# Patient Record
Sex: Female | Born: 1970 | Race: White | Hispanic: No | Marital: Married | Smoking: Never smoker
Health system: Southern US, Community
[De-identification: ages and names within clinical notes are randomized; demographics above are authoritative.]

## PROBLEM LIST (undated history)

## (undated) DIAGNOSIS — F32A Depression, unspecified: Secondary | ICD-10-CM

## (undated) DIAGNOSIS — T7840XA Allergy, unspecified, initial encounter: Secondary | ICD-10-CM

## (undated) DIAGNOSIS — E079 Disorder of thyroid, unspecified: Secondary | ICD-10-CM

## (undated) DIAGNOSIS — F410 Panic disorder [episodic paroxysmal anxiety] without agoraphobia: Secondary | ICD-10-CM

## (undated) DIAGNOSIS — G4700 Insomnia, unspecified: Secondary | ICD-10-CM

## (undated) DIAGNOSIS — H8109 Meniere's disease, unspecified ear: Secondary | ICD-10-CM

## (undated) DIAGNOSIS — Z975 Presence of (intrauterine) contraceptive device: Secondary | ICD-10-CM

## (undated) DIAGNOSIS — I10 Essential (primary) hypertension: Secondary | ICD-10-CM

## (undated) DIAGNOSIS — G4730 Sleep apnea, unspecified: Secondary | ICD-10-CM

## (undated) DIAGNOSIS — K219 Gastro-esophageal reflux disease without esophagitis: Secondary | ICD-10-CM

## (undated) HISTORY — DX: Panic disorder (episodic paroxysmal anxiety): F41.0

## (undated) HISTORY — DX: Disorder of thyroid, unspecified: E07.9

## (undated) HISTORY — DX: Gastro-esophageal reflux disease without esophagitis: K21.9

## (undated) HISTORY — DX: Meniere's disease, unspecified ear: H81.09

## (undated) HISTORY — DX: Allergy, unspecified, initial encounter: T78.40XA

## (undated) HISTORY — DX: Insomnia, unspecified: G47.00

## (undated) HISTORY — DX: Presence of (intrauterine) contraceptive device: Z97.5

## (undated) HISTORY — DX: Depression, unspecified: F32.A

## (undated) HISTORY — DX: Sleep apnea, unspecified: G47.30

## (undated) HISTORY — DX: Essential (primary) hypertension: I10

---

## 1997-11-19 ENCOUNTER — Emergency Department (HOSPITAL_COMMUNITY): Admission: EM | Admit: 1997-11-19 | Discharge: 1997-11-19 | Payer: Self-pay | Admitting: Emergency Medicine

## 1998-03-06 ENCOUNTER — Other Ambulatory Visit: Admission: RE | Admit: 1998-03-06 | Discharge: 1998-03-06 | Payer: Self-pay | Admitting: *Deleted

## 1998-12-30 ENCOUNTER — Inpatient Hospital Stay (HOSPITAL_COMMUNITY): Admission: AD | Admit: 1998-12-30 | Discharge: 1999-01-02 | Payer: Self-pay | Admitting: *Deleted

## 1998-12-30 ENCOUNTER — Encounter (INDEPENDENT_AMBULATORY_CARE_PROVIDER_SITE_OTHER): Payer: Self-pay

## 1999-02-07 ENCOUNTER — Other Ambulatory Visit: Admission: RE | Admit: 1999-02-07 | Discharge: 1999-02-07 | Payer: Self-pay | Admitting: *Deleted

## 2000-04-30 ENCOUNTER — Other Ambulatory Visit: Admission: RE | Admit: 2000-04-30 | Discharge: 2000-04-30 | Payer: Self-pay | Admitting: *Deleted

## 2000-11-12 ENCOUNTER — Inpatient Hospital Stay (HOSPITAL_COMMUNITY): Admission: AD | Admit: 2000-11-12 | Discharge: 2000-11-14 | Payer: Self-pay | Admitting: *Deleted

## 2001-01-01 ENCOUNTER — Other Ambulatory Visit: Admission: RE | Admit: 2001-01-01 | Discharge: 2001-01-01 | Payer: Self-pay | Admitting: *Deleted

## 2002-01-17 ENCOUNTER — Other Ambulatory Visit: Admission: RE | Admit: 2002-01-17 | Discharge: 2002-01-17 | Payer: Self-pay | Admitting: *Deleted

## 2002-09-22 ENCOUNTER — Encounter (INDEPENDENT_AMBULATORY_CARE_PROVIDER_SITE_OTHER): Payer: Self-pay | Admitting: Specialist

## 2002-09-22 ENCOUNTER — Encounter: Payer: Self-pay | Admitting: Internal Medicine

## 2002-09-22 ENCOUNTER — Ambulatory Visit (HOSPITAL_COMMUNITY): Admission: RE | Admit: 2002-09-22 | Discharge: 2002-09-22 | Payer: Self-pay | Admitting: Internal Medicine

## 2003-02-09 ENCOUNTER — Other Ambulatory Visit: Admission: RE | Admit: 2003-02-09 | Discharge: 2003-02-09 | Payer: Self-pay | Admitting: *Deleted

## 2004-03-19 ENCOUNTER — Other Ambulatory Visit: Admission: RE | Admit: 2004-03-19 | Discharge: 2004-03-19 | Payer: Self-pay | Admitting: Obstetrics and Gynecology

## 2005-06-04 ENCOUNTER — Other Ambulatory Visit: Admission: RE | Admit: 2005-06-04 | Discharge: 2005-06-04 | Payer: Self-pay | Admitting: Obstetrics and Gynecology

## 2007-11-26 ENCOUNTER — Encounter: Admission: RE | Admit: 2007-11-26 | Discharge: 2007-11-26 | Payer: Self-pay | Admitting: Obstetrics and Gynecology

## 2010-11-01 NOTE — Op Note (Signed)
[facility]  Patient:    Mia Vasquez, Mia Vasquez                      MRN: [id number] Proc. Date: 11/12/00 Adm. Date:  [id number] Attending:  Donne Hazel                           Operative Report  PREOPERATIVE DIAGNOSES:       1. Intrauterine pregnancy at term.                               2. Repeat cesarean section.  POSTOPERATIVE DIAGNOSES:      1. Intrauterine pregnancy at term.                               2. Repeat cesarean section.  OPERATION:                    Repeat low transverse cesarean section.  SURGEON:                      Willey Blade, M.D.  ASSISTANT:                    Trevor Iha, M.D.  ANESTHESIA:                   Spinal.  ESTIMATED BLOOD LOSS:         900 cc.  COMPLICATIONS:                None.  FINDINGS:                     At 39, through a low transverse uterine incision, a viable female infant was delivered from the vertex presentation. The baby was a female with weight pending.  Apgars were 8 and 9.  The pelvis was visualized at time of surgery and noted to be normal.  DESCRIPTION OF PROCEDURE:     The patient was taken to the operating room, where a spinal anesthetic was administered.  The patient was placed on the operating table in the left lateral tilt position, the abdomen was prepped and draped in the usual sterile fashion with Betadine and sterile drapes.  A Foley catheter was inserted sterilely.  Next, the abdomen was entered through a Pfannenstiel incision and carried down sharply in the usual fashion.  The peritoneum was atraumatically entered.  The vesicouterine peritoneum overlying the lower uterine segment was incised, and a bladder flap was bluntly and sharply created over the lower uterine segment.  A bladder blade was then placed behind the bladder.  Next, a low transverse incision was made and the uterine incision extended laterally using the operators fingers.  The membranes were  entered with clear fluid noted.  The vertex was elevated into the incision and delivered promptly and easily at 0746.  The oropharynx and nasopharynx were thoroughly bulb-suctioned and the cord doubly clamped and cut.  The baby was handed promptly to the pediatricians.  The baby did well. Apgars were 8 and 9.  The baby was a female, and weight is pending.  The placenta was manually extracted intact with three-vessel cord without difficulty.  The interior of the uterus was wiped clean thoroughly with a wet  sponge.  The uterine incision was then closed in a two-layer fashion, the first layer a running interlocking suture of #1 Vicryl.  A second imbricating suture was placed across the primary suture line with a running stitch of #1 Vicryl as well.  Good hemostasis was noted.  The pelvis was then thoroughly irrigated with copious amounts of irrigant and noted to be normal.  Hemostasis was also noted.  Attention was then turned to closure.  The rectus muscle and anterior peritoneum was reapproximated in the midline with a running stitch of #1 Vicryl.  The subfascial layers were hemostatic.  The fascia was then closed with two sutures of 0 Panacryl in running fashion.  The subcutaneous tissue was irrigated and made hemostatic using the Bovie cautery.  The skin reapproximated with staples and a sterile dressing applied.  Final sponge, needle, and instrument count was correct x 3.  There were no perioperative complications.  The patient did receive 1 g of Cefotan after delivery. DD:  11/12/00 TD:  11/12/00 Job: [id number]

## 2010-11-01 NOTE — Discharge Summary (Signed)
[facility]  Patient:    Mia Vasquez, Mia Vasquez                      MRN: [id number] Adm. Date:  [id number] Disc. Date: [id number] Attending:  Donne Hazel Dictator:   Danie Chandler, R.N.                           Discharge Summary  ADMITTING DIAGNOSES:          1. Intrauterine pregnancy at term.                               2. Repeat cesarean section.  DISCHARGE DIAGNOSES:          1. Intrauterine pregnancy at term.                               2. Repeat cesarean section.  PROCEDURE ON Nov 12, 2000:    Repeat low transverse cesarean section.  REASON FOR ADMISSION:         Please see H&P.  HOSPITAL COURSE:              The patient was taken to the operating room and underwent the above named procedure without complication. This was productive of a viable female infant with Apgars of 8 at one minute and 9 at five minutes. Postoperatively on day #1, the patient had a good return of bowel function. She was tolerating a regular diet. Her hemoglobin on this day was 9.6, hematocrit 28.4, and white blood cell count 13.3. On postoperative day #2, the patient had good control of pain, was ambulating well without difficulty, and she requested discharge home.  CONDITION ON DISCHARGE:       Good.  DIET:                         Regular as tolerated.  ACTIVITY:                     No heavy lifting, no driving, no vaginal entry.  DISCHARGE FOLLOWUP:           She is to follow up in the office for staple removal and she is to call for temperature greater than 100 degrees, persistent nausea or vomiting, heavy vaginal bleeding, and/or redness or drainage from the incision site.  DISCHARGE MEDICATIONS:        1. Prenatal vitamin one p.o. q.d.                               2. Tylox as directed by M.D. DD:  12/07/00 TD:  12/07/00 Job: [id number]

## 2011-07-07 ENCOUNTER — Other Ambulatory Visit (HOSPITAL_COMMUNITY): Payer: Self-pay | Admitting: Obstetrics and Gynecology

## 2011-07-07 DIAGNOSIS — E041 Nontoxic single thyroid nodule: Secondary | ICD-10-CM

## 2011-07-08 ENCOUNTER — Other Ambulatory Visit (HOSPITAL_COMMUNITY): Payer: Self-pay

## 2011-07-11 ENCOUNTER — Ambulatory Visit (HOSPITAL_COMMUNITY)
Admission: RE | Admit: 2011-07-11 | Discharge: 2011-07-11 | Disposition: A | Discharge status: Home / Self Care | Payer: BC Managed Care – PPO | Source: Ambulatory Visit | Attending: Obstetrics and Gynecology | Admitting: Obstetrics and Gynecology

## 2011-07-11 DIAGNOSIS — E042 Nontoxic multinodular goiter: Secondary | ICD-10-CM | POA: Insufficient documentation

## 2011-07-11 DIAGNOSIS — E041 Nontoxic single thyroid nodule: Secondary | ICD-10-CM

## 2011-08-11 ENCOUNTER — Encounter (INDEPENDENT_AMBULATORY_CARE_PROVIDER_SITE_OTHER): Payer: Self-pay | Admitting: Surgery

## 2011-08-18 ENCOUNTER — Ambulatory Visit (INDEPENDENT_AMBULATORY_CARE_PROVIDER_SITE_OTHER): Payer: BC Managed Care – PPO | Admitting: Surgery

## 2011-09-17 ENCOUNTER — Ambulatory Visit (INDEPENDENT_AMBULATORY_CARE_PROVIDER_SITE_OTHER): Payer: BC Managed Care – PPO | Admitting: Surgery

## 2011-09-17 ENCOUNTER — Encounter (INDEPENDENT_AMBULATORY_CARE_PROVIDER_SITE_OTHER): Payer: Self-pay | Admitting: Surgery

## 2011-09-17 VITALS — BP 132/88 | HR 81 | Temp 97.7°F | Resp 16 | Ht 67.0 in | Wt 208.4 lb

## 2011-09-17 DIAGNOSIS — E042 Nontoxic multinodular goiter: Secondary | ICD-10-CM

## 2011-09-17 NOTE — Patient Instructions (Signed)
Thyroid Diseases Your thyroid is a butterfly-shaped gland in your neck. It is located just above your collarbone. It is one of your endocrine glands, which make hormones. The thyroid helps set your metabolism. Metabolism is how your body gets energy from the foods you eat.  Millions of people have thyroid diseases. Women experience thyroid problems more often than men. In fact, overactive thyroid problems (hyperthyroidism) occur in 1% of all women. If you have a thyroid disease, your body may use energy more slowly or quickly than it should.  Thyroid problems also include an immune disease where your body reacts against your thyroid gland (called thyroiditis). A different problem involves lumps and bumps (called nodules) that develop in the gland. The nodules are usually, but not always, noncancerous. THE MOST COMMON THYROID PROBLEMS AND CAUSES ARE DISCUSSED BELOW There are many causes for thyroid problems. Treatment depends upon the exact diagnosis and includes trying to reset your body's metabolism to a normal rate. Hyperthyroidism Too much thyroid hormone from an overactive thyroid gland is called hyperthyroidism. In hyperthyroidism, the body's metabolism speeds up. One of the most frequent forms of hyperthyroidism is known as Graves' disease. Graves' disease tends to run in families. Although Graves' is thought to be caused by a problem with the immune system, the exact nature of the genetic problem is unknown. Hypothyroidism Too little thyroid hormone from an underactive thyroid gland is called hypothyroidism. In hypothyroidism, the body's metabolism is slowed. Several things can cause this condition. Most causes affect the thyroid gland directly and hurt its ability to make enough hormone.  Rarely, there may be a pituitary gland tumor (located near the base of the brain). The tumor can block the pituitary from producing thyroid-stimulating hormone (TSH). Your body makes TSH to stimulate the thyroid  to work properly. If the pituitary does not make enough TSH, the thyroid fails to make enough hormones needed for good health. Whether the problem is caused by thyroid conditions or by the pituitary gland, the result is that the thyroid is not making enough hormones. Hypothyroidism causes many physical and mental processes to become sluggish. The body consumes less oxygen and produces less body heat. Thyroid Nodules A thyroid nodule is a small swelling or lump in the thyroid gland. They are common. These nodules represent either a growth of thyroid tissue or a fluid-filled cyst. Both form a lump in the thyroid gland. Almost half of all people will have tiny thyroid nodules at some point in their lives. Typically, these are not noticeable until they become large and affect normal thyroid size. Larger nodules that are greater than a half inch across (about 1 centimeter) occur in about 5 percent of people. Although most nodules are not cancerous, people who have them should seek medical care to rule out cancer. Also, some thyroid nodules may produce too much thyroid hormone or become too large. Large nodules or a large gland can interfere with breathing or swallowing or may cause neck discomfort. Other problems Other thyroid problems include cancer and thyroiditis. Thyroiditis is a malfunction of the body's immune system. Normally, the immune system works to defend the body against infection and other problems. When the immune system is not working properly, it may mistakenly attack normal cells, tissues, and organs. Examples of autoimmune diseases are Hashimoto's thyroiditis (which causes low thyroid function) and Graves' disease (which causes excess thyroid function). SYMPTOMS  Symptoms vary greatly depending upon the exact type of problem with the thyroid. Hyperthyroidism-is when your thyroid is too   active and makes more thyroid hormone than your body needs. The most common cause is Graves' Disease. Too  much thyroid hormone can cause some or all of the following symptoms:  Anxiety.   Irritability.   Difficulty sleeping.   Fatigue.   A rapid or irregular heartbeat.   A fine tremor of your hands or fingers.   An increase in perspiration.   Sensitivity to heat.   Weight loss, despite normal food intake.   Brittle hair.   Enlargement of your thyroid gland (goiter).   Light menstrual periods.   Frequent bowel movements.  Graves' disease can specifically cause eye and skin problems. The skin problems involve reddening and swelling of the skin, often on your shins and on the top of your feet. Eye problems can include the following:  Excess tearing and sensation of grit or sand in either or both eyes.   Reddened or inflamed eyes.   Widening of the space between your eyelids.   Swelling of the lids and tissues around the eyes.   Light sensitivity.   Ulcers on the cornea.   Double vision.   Limited eye movements.   Blurred or reduced vision.  Hypothyroidism- is when your thyroid gland is not active enough. This is more common than hyperthyroidism. Symptoms can vary a lot depending of the severity of the hormone deficiency. Symptoms may develop over a long period of time and can include several of the following:  Fatigue.   Sluggishness.   Increased sensitivity to cold.   Constipation.   Pale, dry skin.   A puffy face.   Hoarse voice.   High blood cholesterol level.   Unexplained weight gain.   Muscle aches, tenderness and stiffness.   Pain, stiffness or swelling in your joints.   Muscle weakness.   Heavier than normal menstrual periods.   Brittle fingernails and hair.   Depression.  Thyroid Nodules - most do not cause signs or symptoms. Occasionally, some may become so large that you can feel or even see the swelling at the base of your neck. You may realize a lump or swelling is there when you are shaving or putting on makeup. Men might become  aware of a nodule when shirt collars suddenly feel too tight. Some nodules produce too much thyroid hormone. This can produce the same symptoms as hyperthyroidism (see above). Thyroid nodules are seldom cancerous. However, a nodule is more likely to be malignant (cancerous) if it:  Grows quickly or feels hard.   Causes you to become hoarse or to have trouble swallowing or breathing.   Causes enlarged lymph nodes under your jaw or in your neck.  DIAGNOSIS  Because there are so many possible thyroid conditions, your caregiver may ask for a number of tests. They will do this in order to narrow down the exact diagnosis. These tests can include:  Blood and antibody tests.   Special thyroid scans using small, safe amounts of radioactive iodine.   Ultrasound of the thyroid gland (particularly if there is a nodule or lump).   Biopsy. This is usually done with a special needle. A needle biopsy is a procedure to obtain a sample of cells from the thyroid. The tissue will be tested in a lab and examined under a microscope.  TREATMENT  Treatment depends on the exact diagnosis. Hyperthyroidism  Beta-blockers help relieve many of the symptoms.   Anti-thyroid medications prevent the thyroid from making excess hormones.   Radioactive iodine treatment can destroy overactive thyroid   cells. The iodine can permanently decrease the amount of hormone produced.   Surgery to remove the thyroid gland.   Treatments for eye problems that come from Graves' disease also include medications and special eye surgery, if felt to be appropriate.  Hypothyroidism Thyroid replacement with levothyroxine is the mainstay of treatment. Treatment with thyroid replacement is usually lifelong and will require monitoring and adjustment from time to time. Thyroid Nodules  Watchful waiting. If a small nodule causes no symptoms or signs of cancer on biopsy, then no treatment may be chosen at first. Re-exam and re-checking blood  tests would be the recommended follow-up.   Anti-thyroid medications or radioactive iodine treatment may be recommended if the nodules produce too much thyroid hormone (see Treatment for Hyperthyroidism above).   Alcohol ablation. Injections of small amounts of ethyl alcohol (ethanol) can cause a non-cancerous nodule to shrink in size.   Surgery (see Treatment for Hyperthyroidism above).  HOME CARE INSTRUCTIONS   Take medications as instructed.   Follow through on recommended testing.  SEEK MEDICAL CARE IF:   You feel that you are developing symptoms of Hyperthyroidism or Hypothyroidism as described above.   You develop a new lump/nodule in the neck/thyroid area that you had not noticed before.   You feel that you are having side effects from medicines prescribed.   You develop trouble breathing or swallowing.  SEEK IMMEDIATE MEDICAL CARE IF:   You develop a fever of 102 F (38.9 C) or higher.   You develop severe sweating.   You develop palpitations and/or rapid heart beat.   You develop shortness of breath.   You develop nausea and vomiting.   You develop extreme shakiness.   You develop agitation.   You develop lightheadedness or have a fainting episode.  Document Released: 03/30/2007 Document Revised: 05/22/2011 Document Reviewed: 03/30/2007 Exit[employer] Patient Information 2012 [employer]. 

## 2011-09-17 NOTE — Progress Notes (Signed)
Chief Complaint  Patient presents with  . Thyroid Nodule    Evaluate thyroid - referral from Dr. Candice Camp, [facility]    HISTORY: Patient is a 41 year old  Female referred by her gynecologist for evaluation of bilateral thyroid nodules. These have been present for at least 10 years. She had undergone previous ultrasound and biopsy in 2004. This showed benign follicular epithelium consistent with a colloid nodule. There was no atypia.  Patient did have sporadic followup over the next few years. In January she was seen by her gynecologist. She again was noted to have palpable thyroid nodules. She underwent a thyroid ultrasound showing bilateral thyroid nodules and cyst.The dominant nodule was in the left lobe measuring 2.0 cm. This was unchanged compared to her study in 2004. Thyroid function tests were checked in her TSH level was normal at 1.36.  The patient has had no prior surgery on the head or neck. She has never been on thyroid medication. There is no family history of thyroid disease. There is no family history of other endocrinopathy.  Past Medical History  Diagnosis Date  . Thyroid disease   . Hypertension   . Panic attack   . IUD (intrauterine device) in place      Current Outpatient Prescriptions  Medication Sig Dispense Refill  . clonazePAM (KLONOPIN) 0.5 MG tablet Take 0.5 mg by mouth 2 (two) times daily as needed.      Marland Kitchen levonorgestrel (MIRENA) 20 MCG/24HR IUD 1 each by Intrauterine route once.      Marland Kitchen PARoxetine (PAXIL) 20 MG tablet Take 20 mg by mouth every morning.      . triamterene-hydrochlorothiazide (MAXZIDE) 75-50 MG per tablet       . Vitamin D, Ergocalciferol, (DRISDOL) 50000 UNITS CAPS       . chlorpheniramine-HYDROcodone (TUSSIONEX) 10-8 MG/5ML LQCR       . triamterene (DYRENIUM) 100 MG capsule Take by mouth 2 (two) times daily.         Allergies  Allergen Reactions  . Shrimp (Shellfish Allergy) Anaphylaxis     Family History    Problem Relation Age of Onset  . Heart disease    . Hypertension    . Cancer    . Cancer Maternal Grandmother     Ovarian Cancer  . Cancer Maternal Grandfather     Lung Cancer  . Cancer Paternal Grandmother     breast cancer     History   Social History  . Marital Status: Married    Spouse Name: N/A    Number of Children: N/A  . Years of Education: N/A   Social History Main Topics  . Smoking status: Never Smoker   . Smokeless tobacco: None  . Alcohol Use: No  . Drug Use: No  . Sexually Active: None   Other Topics Concern  . None   Social History Narrative  . None     REVIEW OF SYSTEMS - PERTINENT POSITIVES ONLY: Denies tremor. Denies palpitations. Denies compressive symptoms.  EXAM: Filed Vitals:   09/17/11 0851  BP: 132/88  Pulse: 81  Temp: 97.7 F (36.5 C)  Resp: 16    HEENT: normocephalic; pupils equal and reactive; sclerae clear; dentition good; mucous membranes moist NECK:  Thyroid slightly larger than normal. Palpation reveals a soft 2 cm nodule in the left thyroid lobe. This is mobile. It is nontender. Remainder of the gland is slightly firm without dominant or discrete mass; symmetric on extension; no palpable anterior or  posterior cervical lymphadenopathy; no supraclavicular masses; no tenderness CHEST: clear to auscultation bilaterally without rales, rhonchi, or wheezes CARDIAC: regular rate and rhythm without significant murmur; peripheral pulses are full EXT:  non-tender without edema; no deformity NEURO: no gross focal deficits; no sign of tremor   LABORATORY RESULTS: See [facility] ([facility]pic) for most recent results   RADIOLOGY RESULTS: See [facility] (CHL[facility]pic) for most recent results   IMPRESSION: Multinodular thyroid gland, dominant left thyroid nodule, 2.0 cm, clinically stable  PLAN: Patient and I discussed all the above findings. I have recommended continued close observation. I would like her to eat her thyroid  ultrasound and thyroid function test in one year and repeat her physical examination at that time. I do not see any reason for surgical intervention at this point. She is in agreement and will return for followup as scheduled.  Velora Heckler, MD, FACS General & Endocrine Surgery [facility]   Visit Diagnoses: 1. Multinodular goiter (nontoxic), dominant nodule left lobe     Primary Care Physician: Dr. Tommas Olp  GYN: Dr. Candice Camp

## 2012-07-20 ENCOUNTER — Other Ambulatory Visit: Payer: BC Managed Care – PPO

## 2012-07-21 ENCOUNTER — Telehealth (INDEPENDENT_AMBULATORY_CARE_PROVIDER_SITE_OTHER): Payer: Self-pay

## 2012-07-21 NOTE — Telephone Encounter (Signed)
Per pt request tsh lab slip for [facility] mailed to pt home address.

## 2012-07-23 ENCOUNTER — Ambulatory Visit
Admission: RE | Admit: 2012-07-23 | Discharge: 2012-07-23 | Disposition: A | Discharge status: Home / Self Care | Payer: BC Managed Care – PPO | Source: Ambulatory Visit | Attending: Surgery | Admitting: Surgery

## 2012-07-23 DIAGNOSIS — E042 Nontoxic multinodular goiter: Secondary | ICD-10-CM

## 2012-08-24 ENCOUNTER — Encounter (INDEPENDENT_AMBULATORY_CARE_PROVIDER_SITE_OTHER): Payer: Self-pay

## 2012-09-29 ENCOUNTER — Encounter (INDEPENDENT_AMBULATORY_CARE_PROVIDER_SITE_OTHER): Payer: Self-pay | Admitting: Surgery

## 2012-09-29 ENCOUNTER — Ambulatory Visit (INDEPENDENT_AMBULATORY_CARE_PROVIDER_SITE_OTHER): Payer: BC Managed Care – PPO | Admitting: Surgery

## 2012-09-29 VITALS — BP 122/80 | HR 83 | Temp 97.6°F | Resp 16 | Ht 67.0 in | Wt 228.2 lb

## 2012-09-29 DIAGNOSIS — E042 Nontoxic multinodular goiter: Secondary | ICD-10-CM

## 2012-09-29 NOTE — Patient Instructions (Signed)

## 2012-09-29 NOTE — Progress Notes (Signed)
General Surgery [facility]  Visit Diagnoses: 1. Multinodular goiter (nontoxic), dominant nodule left lobe     HISTORY: Patient is a 42 year old white female followed for bilateral thyroid nodules. She was last seen here one year ago. At my request she underwent a thyroid ultrasound on 07/23/2012. This was compared to her prior study in January 2013. The right lobe is slightly enlarged at 5.6 cm. Left lobe is slightly thickened at 2.5 cm. There are subcentimeter nodules in the upper right lobe. There is a dominant nodule in the left lobe measuring 1.8 x 1.2 x 2.2 cm. This shows slight interval increase in size. It has been previously biopsied with benign cytopathology. Overall the study was felt to be stable by the radiologist.  Repeat TSH level was obtained on 08/12/2012. Is normal at 1.78. The patient is on no thyroid medication.  PERTINENT REVIEW OF SYSTEMS: Denies tremor. Denies palpitations. Denies compressive symptoms. Denies masses. Denies pain.  EXAM: HEENT: normocephalic; pupils equal and reactive; sclerae clear; dentition good; mucous membranes moist NECK:  Palpable nodule in the left thyroid lobe, smooth, mobile, nontender; right thyroid lobe without dominant or discrete mass; symmetric on extension; no palpable anterior or posterior cervical lymphadenopathy; no supraclavicular masses; no tenderness CHEST: clear to auscultation bilaterally without rales, rhonchi, or wheezes CARDIAC: regular rate and rhythm without significant murmur; peripheral pulses are full EXT:  non-tender without edema; no deformity NEURO: no gross focal deficits; no sign of tremor   IMPRESSION: Bilateral thyroid nodules, dominant left thyroid nodule, 2.2 cm, slight interval increase in size  PLAN: The patient and I reviewed the above studies. Overall I feel her small multinodular goiter is stable. However, given the slight interval increase in size of the dominant nodule on the left, I  would like to repeat her thyroid ultrasound in one year. We will also check her TSH level at that time. Patient does not require thyroid hormone supplementation at this point. She does not require operative intervention at this time.  Patient will return in 1 year for physical examination and review of the above studies.  Velora Heckler, MD, [facility] Office: [phone]

## 2013-05-25 DIAGNOSIS — I10 Essential (primary) hypertension: Secondary | ICD-10-CM | POA: Insufficient documentation

## 2013-05-25 DIAGNOSIS — E669 Obesity, unspecified: Secondary | ICD-10-CM | POA: Insufficient documentation

## 2013-09-16 ENCOUNTER — Other Ambulatory Visit: Payer: BC Managed Care – PPO

## 2013-09-22 ENCOUNTER — Ambulatory Visit
Admission: RE | Admit: 2013-09-22 | Discharge: 2013-09-22 | Disposition: A | Discharge status: Home / Self Care | Payer: BC Managed Care – PPO | Source: Ambulatory Visit | Attending: Surgery | Admitting: Surgery

## 2013-09-22 DIAGNOSIS — E042 Nontoxic multinodular goiter: Secondary | ICD-10-CM

## 2013-11-08 ENCOUNTER — Encounter (INDEPENDENT_AMBULATORY_CARE_PROVIDER_SITE_OTHER): Payer: Self-pay | Admitting: Surgery

## 2013-11-08 ENCOUNTER — Ambulatory Visit (INDEPENDENT_AMBULATORY_CARE_PROVIDER_SITE_OTHER): Payer: BC Managed Care – PPO | Admitting: Surgery

## 2013-11-08 VITALS — BP 122/78 | HR 98 | Temp 97.0°F | Ht 67.0 in | Wt 223.0 lb

## 2013-11-08 DIAGNOSIS — E042 Nontoxic multinodular goiter: Secondary | ICD-10-CM

## 2013-11-08 NOTE — Patient Instructions (Signed)

## 2013-11-08 NOTE — Progress Notes (Signed)
General Surgery [facility]  Chief Complaint  Patient presents with  . Follow-up    bilateral thyroid nodules    HISTORY: Patient is a 43 year old female followed for bilateral thyroid nodules. At my request she underwent a thyroid ultrasound on 09/22/2013. This was compared to her prior study from February of 2014. The right thyroid lobe measures 5.3 cm and contains multiple subcentimeter nodules which are unchanged from her prior examination. The left lobe measures 5.0 cm and contains a dominant nodule measuring 2.3 cm in size which is also unchanged from her prior examination. There was no lymphadenopathy. Final reading was stable bilateral thyroid nodules. TSH level is normal at 2.150.  PERTINENT REVIEW OF SYSTEMS: Denies tremor. Denies palpitations. Denies compressive symptoms. Occasional mild dysphagia. Patient is able to palpate dominant nodule on the left.  EXAM: HEENT: normocephalic; pupils equal and reactive; sclerae clear; dentition good; mucous membranes moist NECK:  Right lobe without palpable abnormalities; left lobe with dominant nodule measuring approximately 2 cm in size, firm, mobile, nontender; symmetric on extension; no palpable anterior or posterior cervical lymphadenopathy; no supraclavicular masses; no tenderness CHEST: clear to auscultation bilaterally without rales, rhonchi, or wheezes CARDIAC: regular rate and rhythm without significant murmur; peripheral pulses are full EXT:  non-tender without edema; no deformity NEURO: no gross focal deficits; no sign of tremor   IMPRESSION: Stable bilateral thyroid nodules  PLAN: I discussed the above results with the patient at length. We reviewed her ultrasound report and her laboratory studies. At this point I recommend continued observation. We will repeat her ultrasound and TSH level in one year.  Velora Heckler, MD, [facility] Office: [phone]  Visit Diagnoses: 1.  Multinodular goiter (nontoxic), dominant nodule left lobe

## 2013-11-16 ENCOUNTER — Encounter (INDEPENDENT_AMBULATORY_CARE_PROVIDER_SITE_OTHER): Payer: Self-pay

## 2014-08-21 ENCOUNTER — Other Ambulatory Visit (INDEPENDENT_AMBULATORY_CARE_PROVIDER_SITE_OTHER): Payer: Self-pay | Admitting: Surgery

## 2014-08-21 DIAGNOSIS — E041 Nontoxic single thyroid nodule: Secondary | ICD-10-CM

## 2014-08-21 NOTE — Addendum Note (Signed)
Addended by: Michie Molnar M on: 08/21/2014 03:44 PM   Modules accepted: Orders  

## 2014-08-22 ENCOUNTER — Other Ambulatory Visit (INDEPENDENT_AMBULATORY_CARE_PROVIDER_SITE_OTHER): Payer: Self-pay | Admitting: *Deleted

## 2014-08-22 DIAGNOSIS — E041 Nontoxic single thyroid nodule: Secondary | ICD-10-CM

## 2014-10-17 ENCOUNTER — Ambulatory Visit
Admission: RE | Admit: 2014-10-17 | Discharge: 2014-10-17 | Disposition: A | Discharge status: Home / Self Care | Payer: BLUE CROSS/BLUE SHIELD | Source: Ambulatory Visit | Attending: Surgery | Admitting: Surgery

## 2014-10-17 DIAGNOSIS — E041 Nontoxic single thyroid nodule: Secondary | ICD-10-CM

## 2014-10-19 ENCOUNTER — Other Ambulatory Visit: Payer: Self-pay | Admitting: Obstetrics and Gynecology

## 2014-10-19 DIAGNOSIS — R928 Other abnormal and inconclusive findings on diagnostic imaging of breast: Secondary | ICD-10-CM

## 2014-10-26 ENCOUNTER — Ambulatory Visit
Admission: RE | Admit: 2014-10-26 | Discharge: 2014-10-26 | Disposition: A | Discharge status: Home / Self Care | Payer: BLUE CROSS/BLUE SHIELD | Source: Ambulatory Visit | Attending: Obstetrics and Gynecology | Admitting: Obstetrics and Gynecology

## 2014-10-26 DIAGNOSIS — R928 Other abnormal and inconclusive findings on diagnostic imaging of breast: Secondary | ICD-10-CM

## 2015-10-05 DIAGNOSIS — Z8669 Personal history of other diseases of the nervous system and sense organs: Secondary | ICD-10-CM | POA: Diagnosis not present

## 2015-10-05 DIAGNOSIS — H9319 Tinnitus, unspecified ear: Secondary | ICD-10-CM | POA: Diagnosis not present

## 2015-10-05 DIAGNOSIS — H9202 Otalgia, left ear: Secondary | ICD-10-CM | POA: Diagnosis not present

## 2015-10-05 DIAGNOSIS — J342 Deviated nasal septum: Secondary | ICD-10-CM | POA: Diagnosis not present

## 2015-10-05 DIAGNOSIS — H9193 Unspecified hearing loss, bilateral: Secondary | ICD-10-CM | POA: Diagnosis not present

## 2015-10-11 DIAGNOSIS — H9312 Tinnitus, left ear: Secondary | ICD-10-CM | POA: Diagnosis not present

## 2015-10-11 DIAGNOSIS — H9319 Tinnitus, unspecified ear: Secondary | ICD-10-CM | POA: Diagnosis not present

## 2015-10-11 DIAGNOSIS — Z8669 Personal history of other diseases of the nervous system and sense organs: Secondary | ICD-10-CM | POA: Diagnosis not present

## 2015-10-11 DIAGNOSIS — H9190 Unspecified hearing loss, unspecified ear: Secondary | ICD-10-CM | POA: Diagnosis not present

## 2015-11-26 DIAGNOSIS — R1084 Generalized abdominal pain: Secondary | ICD-10-CM | POA: Diagnosis not present

## 2015-11-26 DIAGNOSIS — I10 Essential (primary) hypertension: Secondary | ICD-10-CM | POA: Diagnosis not present

## 2015-11-26 DIAGNOSIS — R1012 Left upper quadrant pain: Secondary | ICD-10-CM | POA: Diagnosis not present

## 2015-12-03 DIAGNOSIS — R1083 Colic: Secondary | ICD-10-CM | POA: Diagnosis not present

## 2015-12-03 DIAGNOSIS — I10 Essential (primary) hypertension: Secondary | ICD-10-CM | POA: Diagnosis not present

## 2015-12-06 DIAGNOSIS — Z1231 Encounter for screening mammogram for malignant neoplasm of breast: Secondary | ICD-10-CM | POA: Diagnosis not present

## 2015-12-06 DIAGNOSIS — Z6834 Body mass index (BMI) 34.0-34.9, adult: Secondary | ICD-10-CM | POA: Diagnosis not present

## 2015-12-06 DIAGNOSIS — Z01419 Encounter for gynecological examination (general) (routine) without abnormal findings: Secondary | ICD-10-CM | POA: Diagnosis not present

## 2016-01-01 DIAGNOSIS — R1083 Colic: Secondary | ICD-10-CM | POA: Diagnosis not present

## 2016-01-01 DIAGNOSIS — I10 Essential (primary) hypertension: Secondary | ICD-10-CM | POA: Diagnosis not present

## 2016-04-01 DIAGNOSIS — R509 Fever, unspecified: Secondary | ICD-10-CM | POA: Diagnosis not present

## 2016-04-01 DIAGNOSIS — B9689 Other specified bacterial agents as the cause of diseases classified elsewhere: Secondary | ICD-10-CM | POA: Diagnosis not present

## 2016-04-01 DIAGNOSIS — I10 Essential (primary) hypertension: Secondary | ICD-10-CM | POA: Diagnosis not present

## 2016-04-01 DIAGNOSIS — J208 Acute bronchitis due to other specified organisms: Secondary | ICD-10-CM | POA: Diagnosis not present

## 2016-08-07 DIAGNOSIS — H6691 Otitis media, unspecified, right ear: Secondary | ICD-10-CM | POA: Diagnosis not present

## 2016-08-07 DIAGNOSIS — H7291 Unspecified perforation of tympanic membrane, right ear: Secondary | ICD-10-CM | POA: Diagnosis not present

## 2016-08-07 DIAGNOSIS — Z1389 Encounter for screening for other disorder: Secondary | ICD-10-CM | POA: Diagnosis not present

## 2016-09-02 DIAGNOSIS — Z0000 Encounter for general adult medical examination without abnormal findings: Secondary | ICD-10-CM | POA: Diagnosis not present

## 2016-09-02 DIAGNOSIS — E669 Obesity, unspecified: Secondary | ICD-10-CM | POA: Diagnosis not present

## 2016-09-02 DIAGNOSIS — Z6838 Body mass index (BMI) 38.0-38.9, adult: Secondary | ICD-10-CM | POA: Diagnosis not present

## 2016-09-02 DIAGNOSIS — Z131 Encounter for screening for diabetes mellitus: Secondary | ICD-10-CM | POA: Diagnosis not present

## 2016-10-15 ENCOUNTER — Other Ambulatory Visit: Payer: Self-pay | Admitting: Surgery

## 2016-10-15 DIAGNOSIS — E042 Nontoxic multinodular goiter: Secondary | ICD-10-CM

## 2017-03-05 ENCOUNTER — Other Ambulatory Visit: Payer: BLUE CROSS/BLUE SHIELD

## 2017-03-09 ENCOUNTER — Ambulatory Visit
Admission: RE | Admit: 2017-03-09 | Discharge: 2017-03-09 | Disposition: A | Discharge status: Home / Self Care | Payer: BLUE CROSS/BLUE SHIELD | Source: Ambulatory Visit | Attending: Surgery | Admitting: Surgery

## 2017-03-09 DIAGNOSIS — E042 Nontoxic multinodular goiter: Secondary | ICD-10-CM | POA: Diagnosis not present

## 2017-03-12 DIAGNOSIS — E042 Nontoxic multinodular goiter: Secondary | ICD-10-CM | POA: Diagnosis not present

## 2017-03-14 DIAGNOSIS — J019 Acute sinusitis, unspecified: Secondary | ICD-10-CM | POA: Diagnosis not present

## 2017-05-11 DIAGNOSIS — Z6836 Body mass index (BMI) 36.0-36.9, adult: Secondary | ICD-10-CM | POA: Diagnosis not present

## 2017-05-11 DIAGNOSIS — Z01419 Encounter for gynecological examination (general) (routine) without abnormal findings: Secondary | ICD-10-CM | POA: Diagnosis not present

## 2017-05-11 DIAGNOSIS — Z1231 Encounter for screening mammogram for malignant neoplasm of breast: Secondary | ICD-10-CM | POA: Diagnosis not present

## 2017-06-04 DIAGNOSIS — Z30433 Encounter for removal and reinsertion of intrauterine contraceptive device: Secondary | ICD-10-CM | POA: Diagnosis not present

## 2017-06-25 DIAGNOSIS — Z6835 Body mass index (BMI) 35.0-35.9, adult: Secondary | ICD-10-CM | POA: Diagnosis not present

## 2017-06-25 DIAGNOSIS — J329 Chronic sinusitis, unspecified: Secondary | ICD-10-CM | POA: Diagnosis not present

## 2017-07-15 DIAGNOSIS — N939 Abnormal uterine and vaginal bleeding, unspecified: Secondary | ICD-10-CM | POA: Diagnosis not present

## 2017-10-27 DIAGNOSIS — K5900 Constipation, unspecified: Secondary | ICD-10-CM | POA: Diagnosis not present

## 2017-10-27 DIAGNOSIS — E669 Obesity, unspecified: Secondary | ICD-10-CM | POA: Diagnosis not present

## 2017-10-27 DIAGNOSIS — Z6833 Body mass index (BMI) 33.0-33.9, adult: Secondary | ICD-10-CM | POA: Diagnosis not present

## 2017-10-27 DIAGNOSIS — I10 Essential (primary) hypertension: Secondary | ICD-10-CM | POA: Diagnosis not present

## 2017-11-16 DIAGNOSIS — Z13220 Encounter for screening for lipoid disorders: Secondary | ICD-10-CM | POA: Diagnosis not present

## 2017-11-16 DIAGNOSIS — Z131 Encounter for screening for diabetes mellitus: Secondary | ICD-10-CM | POA: Diagnosis not present

## 2017-11-16 DIAGNOSIS — E669 Obesity, unspecified: Secondary | ICD-10-CM | POA: Diagnosis not present

## 2017-11-16 DIAGNOSIS — Z6832 Body mass index (BMI) 32.0-32.9, adult: Secondary | ICD-10-CM | POA: Diagnosis not present

## 2017-11-16 DIAGNOSIS — Z0000 Encounter for general adult medical examination without abnormal findings: Secondary | ICD-10-CM | POA: Diagnosis not present

## 2018-02-26 ENCOUNTER — Other Ambulatory Visit: Payer: Self-pay | Admitting: Surgery

## 2018-02-26 DIAGNOSIS — E042 Nontoxic multinodular goiter: Secondary | ICD-10-CM

## 2018-03-10 ENCOUNTER — Ambulatory Visit
Admission: RE | Admit: 2018-03-10 | Discharge: 2018-03-10 | Disposition: A | Discharge status: Home / Self Care | Payer: BLUE CROSS/BLUE SHIELD | Source: Ambulatory Visit | Attending: Surgery | Admitting: Surgery

## 2018-03-10 DIAGNOSIS — E042 Nontoxic multinodular goiter: Secondary | ICD-10-CM

## 2018-04-09 DIAGNOSIS — E042 Nontoxic multinodular goiter: Secondary | ICD-10-CM | POA: Diagnosis not present

## 2018-07-22 DIAGNOSIS — Z6836 Body mass index (BMI) 36.0-36.9, adult: Secondary | ICD-10-CM | POA: Diagnosis not present

## 2018-07-22 DIAGNOSIS — Z01419 Encounter for gynecological examination (general) (routine) without abnormal findings: Secondary | ICD-10-CM | POA: Diagnosis not present

## 2018-07-22 DIAGNOSIS — Z1231 Encounter for screening mammogram for malignant neoplasm of breast: Secondary | ICD-10-CM | POA: Diagnosis not present

## 2018-08-03 DIAGNOSIS — H6982 Other specified disorders of Eustachian tube, left ear: Secondary | ICD-10-CM | POA: Diagnosis not present

## 2018-08-03 DIAGNOSIS — H8109 Meniere's disease, unspecified ear: Secondary | ICD-10-CM | POA: Diagnosis not present

## 2018-08-03 DIAGNOSIS — E042 Nontoxic multinodular goiter: Secondary | ICD-10-CM | POA: Diagnosis not present

## 2018-08-03 DIAGNOSIS — R0989 Other specified symptoms and signs involving the circulatory and respiratory systems: Secondary | ICD-10-CM | POA: Diagnosis not present

## 2018-08-27 DIAGNOSIS — E042 Nontoxic multinodular goiter: Secondary | ICD-10-CM | POA: Diagnosis not present

## 2018-08-27 DIAGNOSIS — J302 Other seasonal allergic rhinitis: Secondary | ICD-10-CM | POA: Diagnosis not present

## 2018-08-27 DIAGNOSIS — R0989 Other specified symptoms and signs involving the circulatory and respiratory systems: Secondary | ICD-10-CM | POA: Diagnosis not present

## 2018-08-27 DIAGNOSIS — J342 Deviated nasal septum: Secondary | ICD-10-CM | POA: Diagnosis not present

## 2018-11-18 IMAGING — US US THYROID
1 series · 12 of 25 positions shown · non-contrast
Comparison: 10/17/2014

CLINICAL DATA: Multiple thyroid nodules.

EXAM:
THYROID ULTRASOUND
TECHNIQUE: Ultrasound examination of the thyroid gland and adjacent soft
tissues was performed.

[Series 1: us thyroid · 0.04mm/px · 12 of 65 slices shown]
[im 3/65]
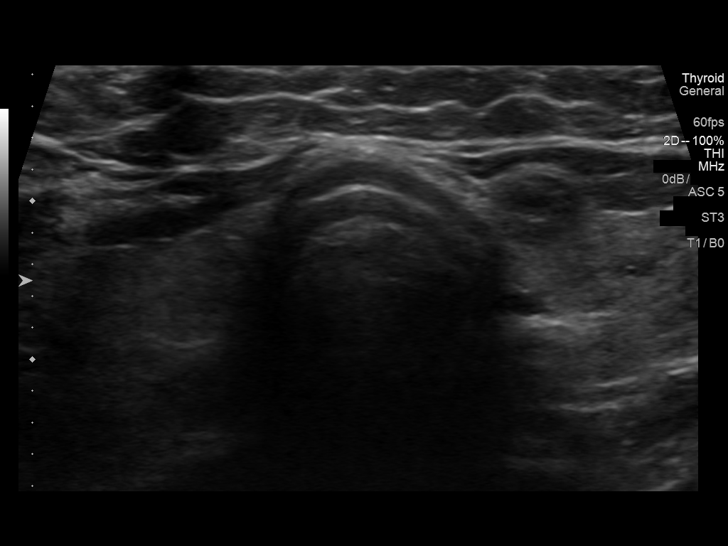
[im 9/65]
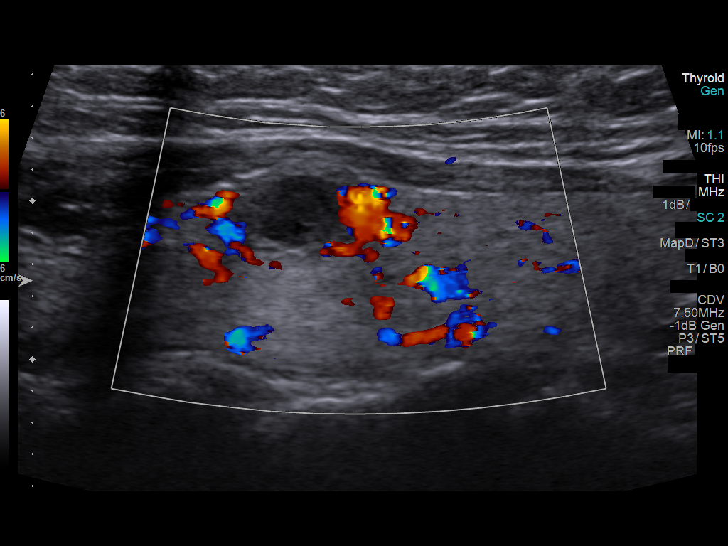
[im 14/65]
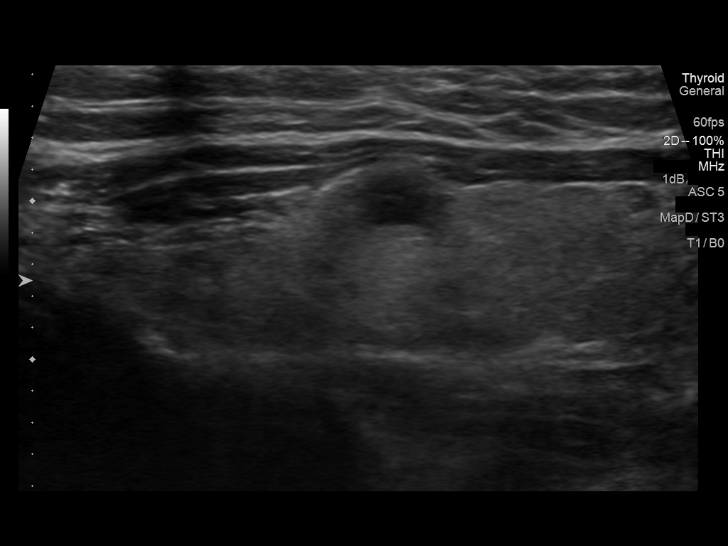
[im 19/65]
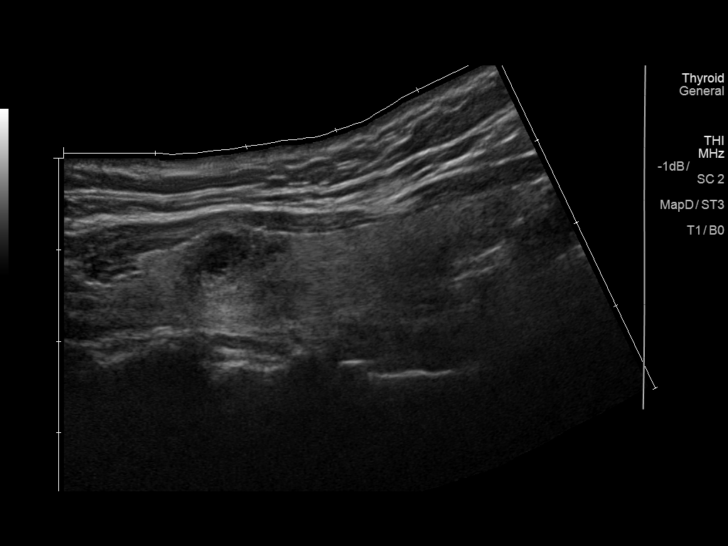
[im 25/65]
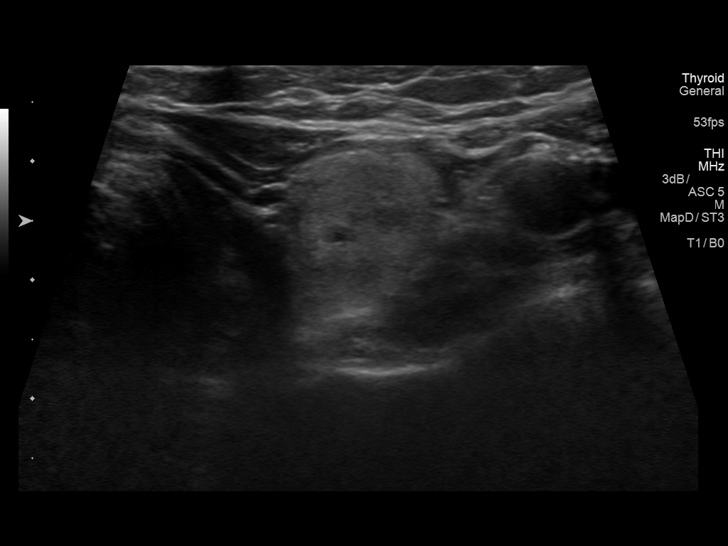
[im 30/65]
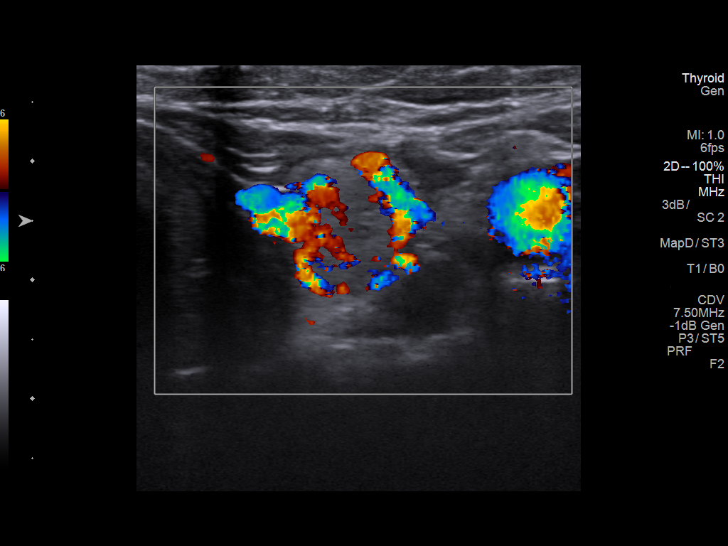
[im 35/65]
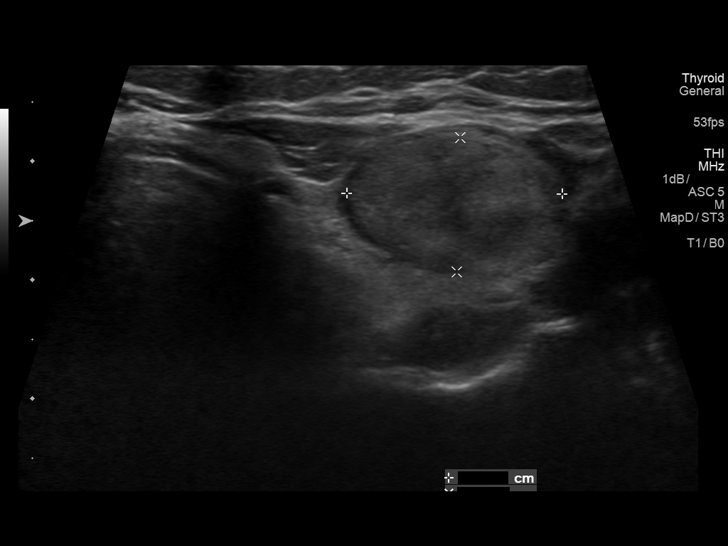
[im 41/65]
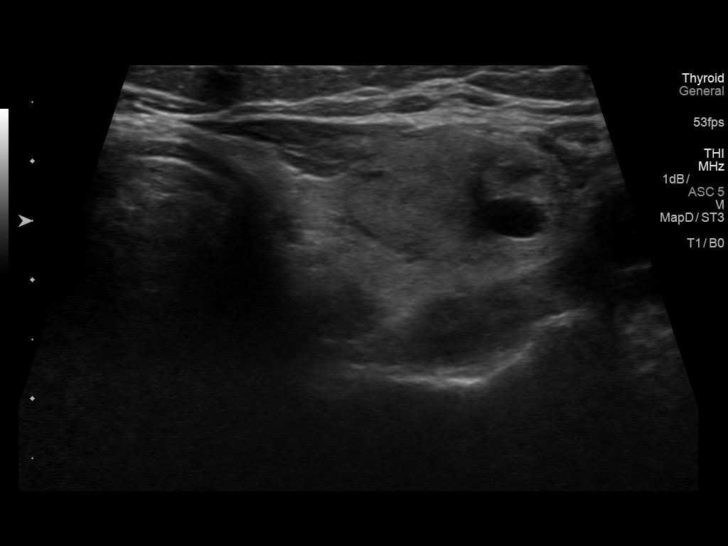
[im 46/65]
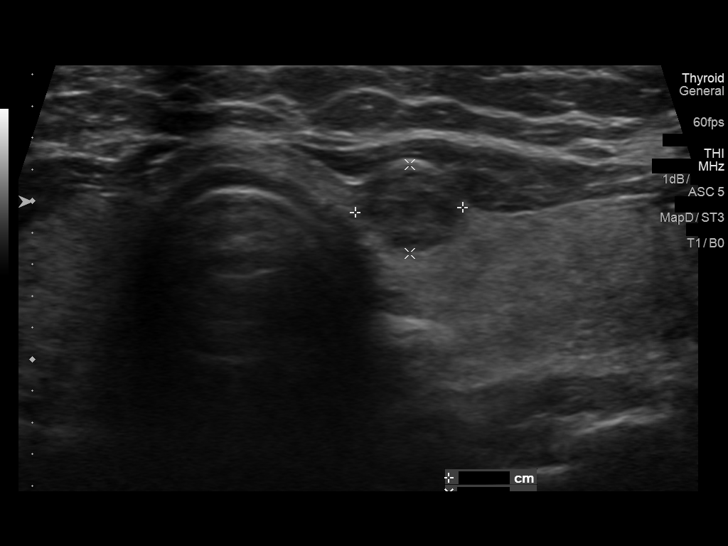
[im 51/65]
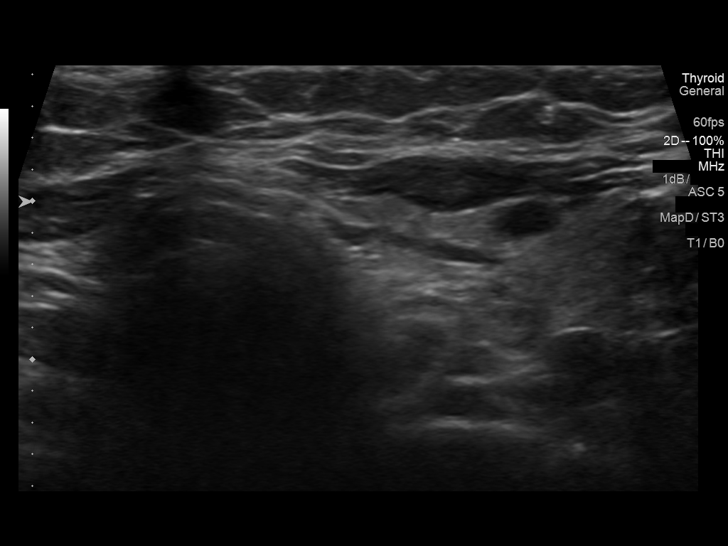
[im 57/65]
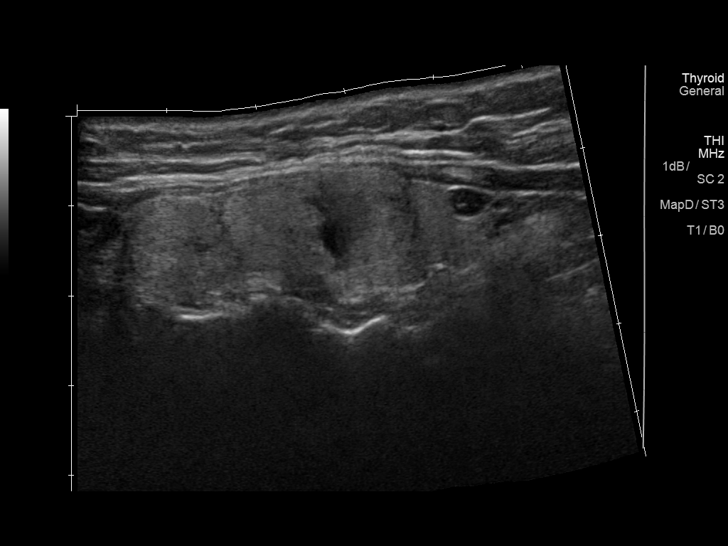
[im 62/65]
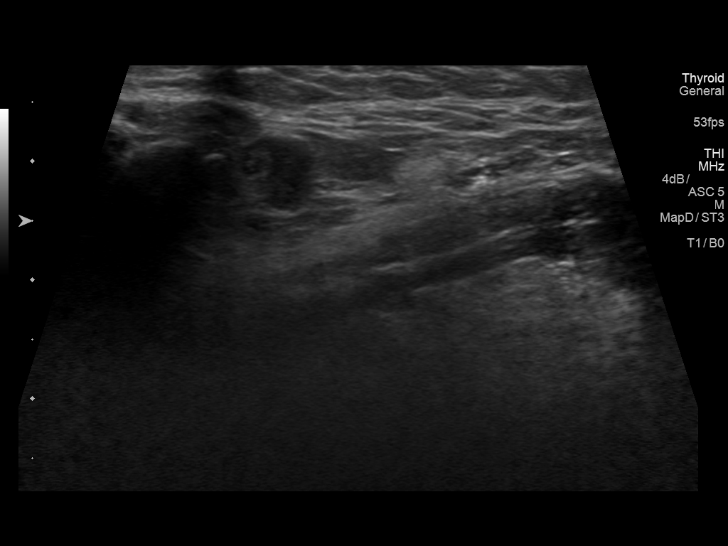

[12 of 25 positions shown; findings below may reference images not displayed]

FINDINGS: Parenchymal Echotexture: Mildly heterogenous

Isthmus: 0.2 cm and stable

Right lobe: 5.2 x 1.2 x 2.4 cm, previously 4.7 x 1.3 x 2.1 cm

Left lobe: 4.9 x 1.4 x 2.5 cm, previously 4.9 x 1.9 x 2.0 cm

_________________________________________________________

Estimated total number of nodules >/= 1 cm: 2

Number of spongiform nodules >/=  2 cm not described below (TR1): 0

Number of mixed cystic and solid nodules >/= 1.5 cm not described
below (TR2): 0

_________________________________________________________

Nodule # 1:

Prior biopsy: No

Location: Right; Superior

Maximum size: 0.9 cm; Other 2 dimensions: 0.6 x 0.9 cm, previously,
0.9 x 0.6 x 0.8 cm

Composition: mixed cystic and solid (1)

Echogenicity: cannot determine (1)

Shape: not taller-than-wide (0)

Margins: ill-defined (0)

Echogenic foci: none (0)

ACR TI-RADS total points: 2.

ACR TI-RADS risk category:  TR2

Significant change in size (>/= 20% in two dimensions and minimal
increase of 2 mm): No

Change in features:  Yes; nodule now has a cystic component.

Change in ACR TI-RADS risk category: No

ACR TI-RADS recommendations:

This nodule does NOT meet TI-RADS criteria for biopsy or dedicated
follow-up.

_________________________________________________________

Nodule # 2:

Prior biopsy: No

Location: Left; Superior

Maximum size: 1.2 cm; Other 2 dimensions: 0.8 x 1.0 cm, previously,
1.2 x 1.1 x 1.2 cm

Composition: solid/almost completely solid (2)

Echogenicity: isoechoic (1)

Shape: not taller-than-wide (0)

Margins: ill-defined (0)

Echogenic foci: none (0)

ACR TI-RADS total points: 3.

ACR TI-RADS risk category:  TR3 (3 points).

Significant change in size (>/= 20% in two dimensions and minimal
increase of 2 mm): No

Change in features: No

Change in ACR TI-RADS risk category: No

ACR TI-RADS recommendations:

Given size (<1.4 cm) and appearance, this nodule does NOT meet
TI-RADS criteria for biopsy or dedicated follow-up.

_________________________________________________________

Nodule # 3:

Prior biopsy: No

Location: Left; Mid

Maximum size: 2.3 cm; Other 2 dimensions: 1.1 x 1.8 cm, previously,
2.0 x 1.3 x 1.8 cm

Composition: solid/almost completely solid (2)

Echogenicity: isoechoic (1)

Shape: not taller-than-wide (0)

Margins: ill-defined (0)

Echogenic foci: none (0)

ACR TI-RADS total points: 3.

ACR TI-RADS risk category:  TR3 (3 points).

Significant change in size (>/= 20% in two dimensions and minimal
increase of 2 mm): No

Change in features: No

Change in ACR TI-RADS risk category: No

ACR TI-RADS recommendations:

*Given size (>/= 1.5 - 2.4 cm) and appearance, a follow-up
ultrasound in 1 year should be considered based on TI-RADS criteria.

_________________________________________________________

Nodule # 4:

Prior biopsy: No

Location: Left; Mid

Maximum size: 0.8 cm; Other 2 dimensions: 0.6 x 0.7 cm, previously,
0.8 x 0.4 x 0.6 cm

Composition: solid/almost completely solid (2)

Echogenicity: hypoechoic (2)

Shape: not taller-than-wide (0)

Margins: ill-defined (0)

Echogenic foci: none (0)

ACR TI-RADS total points: 4.

ACR TI-RADS risk category:  TR4 (4-6 points).

Significant change in size (>/= 20% in two dimensions and minimal
increase of 2 mm): No

Change in features: No

Change in ACR TI-RADS risk category: No

ACR TI-RADS recommendations:

Given size (<0.9 cm) and appearance, this nodule does NOT meet
TI-RADS criteria for biopsy or dedicated follow-up.

_________________________________________________________
IMPRESSION: Bilateral thyroid nodules. No significant change in the dominant
nodules.

Largest left thyroid nodule measures 2.3 cm and meets criteria for 1
year follow-up.

The above is in keeping with the ACR TI-RADS recommendations - [HOSPITAL] 3028;[DATE].

## 2018-11-19 DIAGNOSIS — Z1331 Encounter for screening for depression: Secondary | ICD-10-CM | POA: Diagnosis not present

## 2018-11-19 DIAGNOSIS — Z6838 Body mass index (BMI) 38.0-38.9, adult: Secondary | ICD-10-CM | POA: Diagnosis not present

## 2018-11-19 DIAGNOSIS — E785 Hyperlipidemia, unspecified: Secondary | ICD-10-CM | POA: Diagnosis not present

## 2018-11-19 DIAGNOSIS — Z0000 Encounter for general adult medical examination without abnormal findings: Secondary | ICD-10-CM | POA: Diagnosis not present

## 2018-11-19 DIAGNOSIS — E669 Obesity, unspecified: Secondary | ICD-10-CM | POA: Diagnosis not present

## 2019-08-29 DIAGNOSIS — Z6835 Body mass index (BMI) 35.0-35.9, adult: Secondary | ICD-10-CM | POA: Diagnosis not present

## 2019-08-29 DIAGNOSIS — Z1231 Encounter for screening mammogram for malignant neoplasm of breast: Secondary | ICD-10-CM | POA: Diagnosis not present

## 2019-08-29 DIAGNOSIS — Z01419 Encounter for gynecological examination (general) (routine) without abnormal findings: Secondary | ICD-10-CM | POA: Diagnosis not present

## 2019-11-10 DIAGNOSIS — D509 Iron deficiency anemia, unspecified: Secondary | ICD-10-CM | POA: Diagnosis not present

## 2019-11-10 DIAGNOSIS — D259 Leiomyoma of uterus, unspecified: Secondary | ICD-10-CM | POA: Diagnosis not present

## 2019-11-10 DIAGNOSIS — N939 Abnormal uterine and vaginal bleeding, unspecified: Secondary | ICD-10-CM | POA: Diagnosis not present

## 2019-11-19 IMAGING — US US THYROID
1 series · 13 of 25 positions shown · non-contrast
Comparison: None.

CLINICAL DATA: Prior ultrasound follow-up. Follow-up nodules.
Left-sided fullness noted by patient.

EXAM:
THYROID ULTRASOUND
TECHNIQUE: Ultrasound examination of the thyroid gland and adjacent soft
tissues was performed.

[Series 1: us thyroid · 0.07mm/px · 13 of 62 slices shown]
[im 1/62]
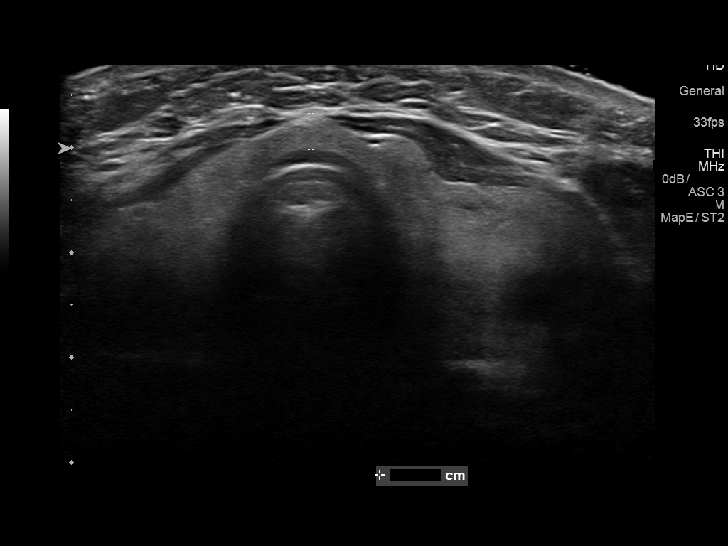
[im 6/62]
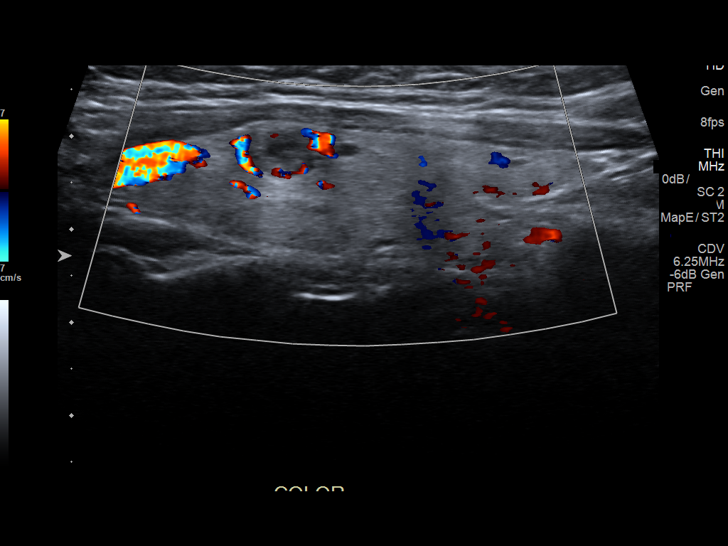
[im 11/62]
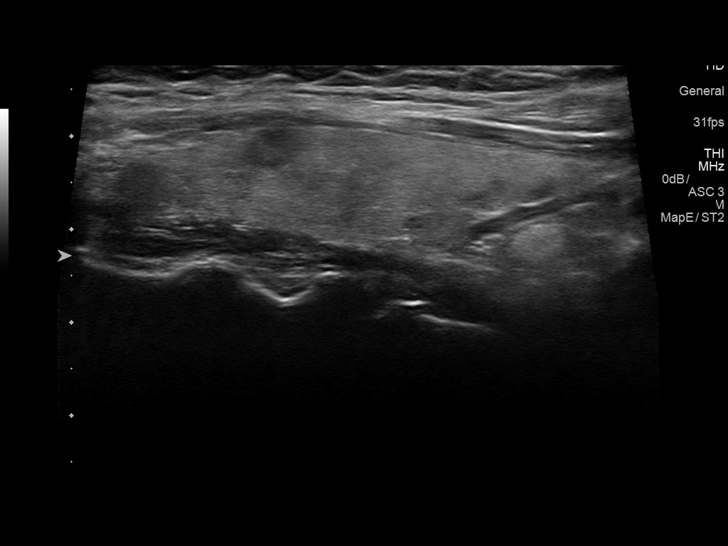
[im 16/62]
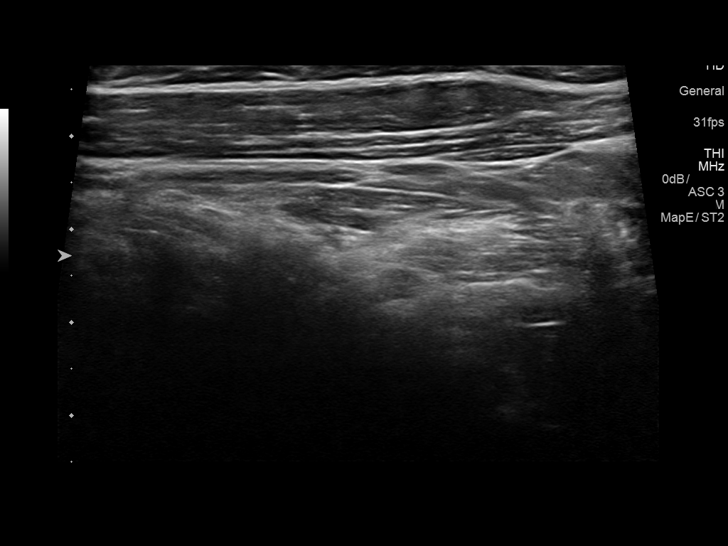
[im 21/62]
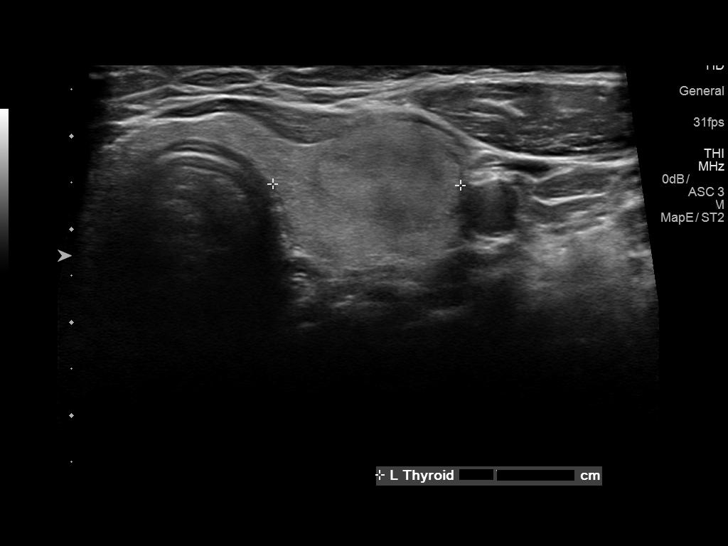
[im 26/62]
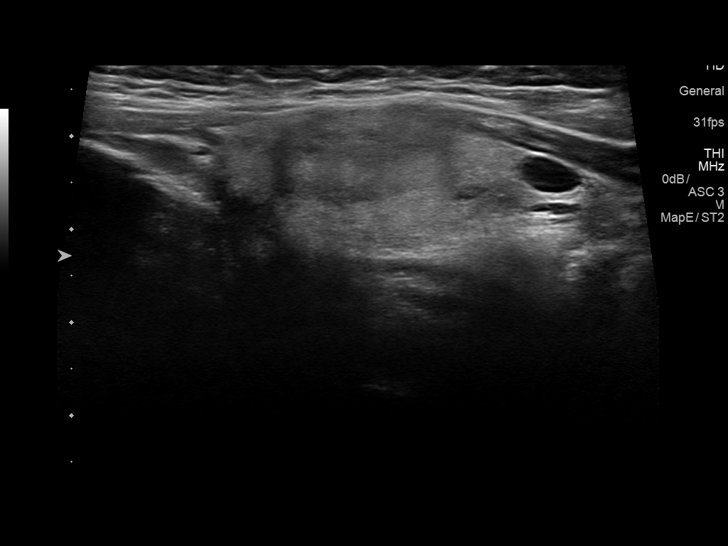
[im 31/62]
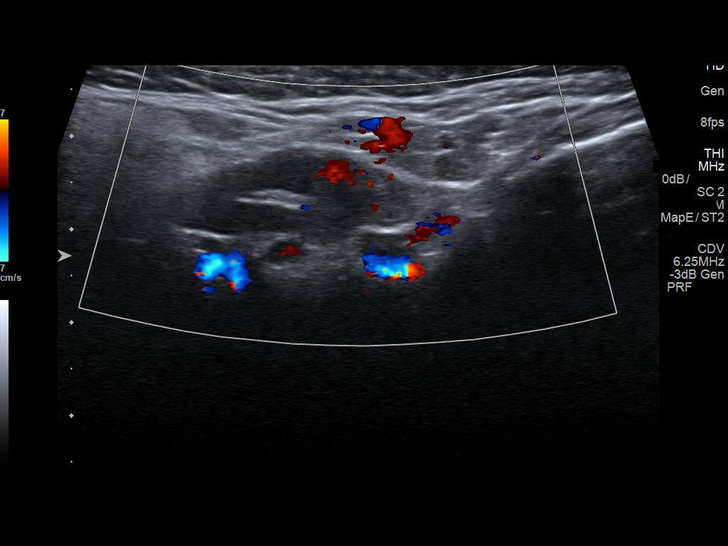
[im 36/62]
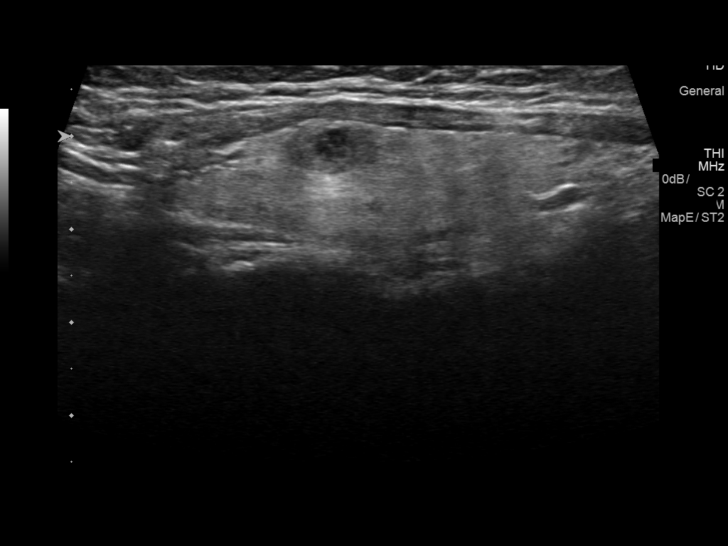
[im 41/62]
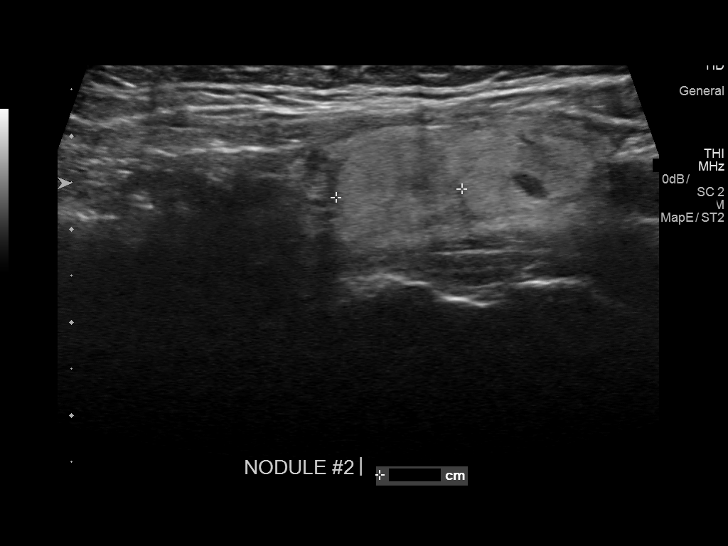
[im 46/62]
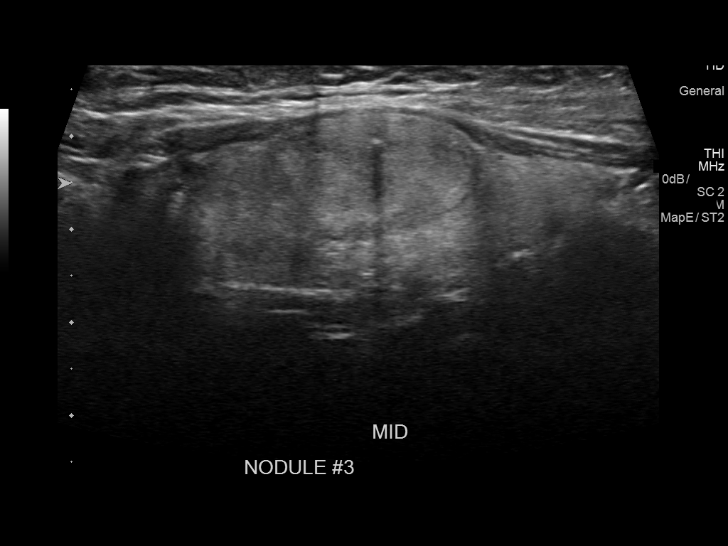
[im 51/62]
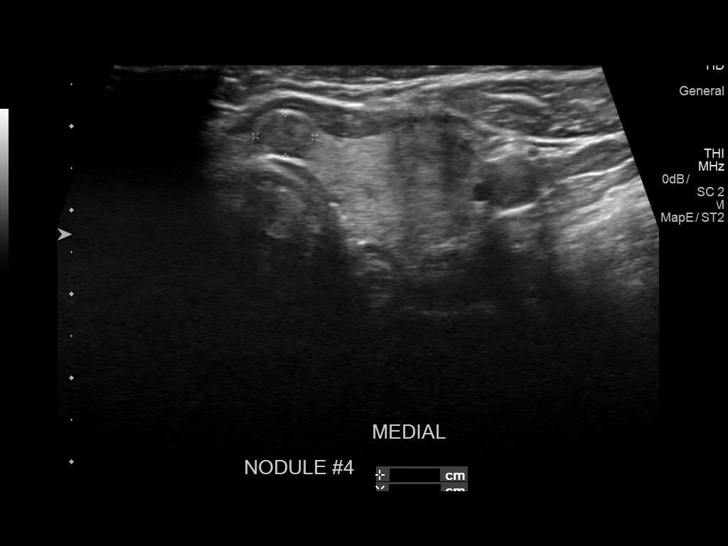
[im 56/62]
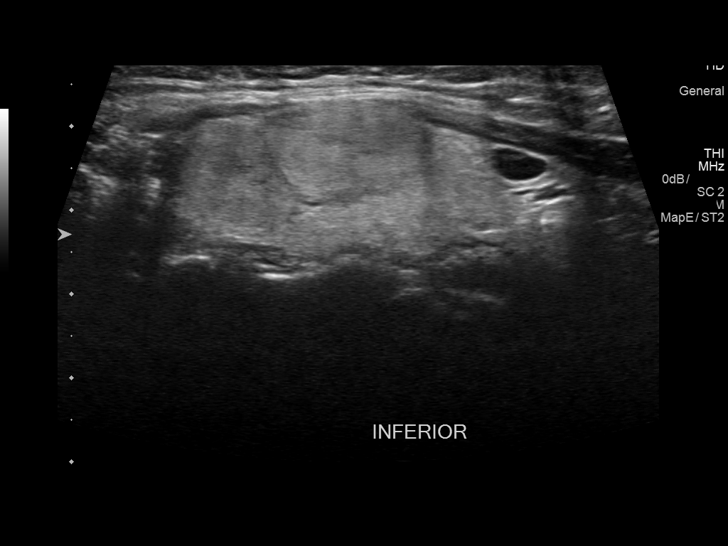
[im 62/62]
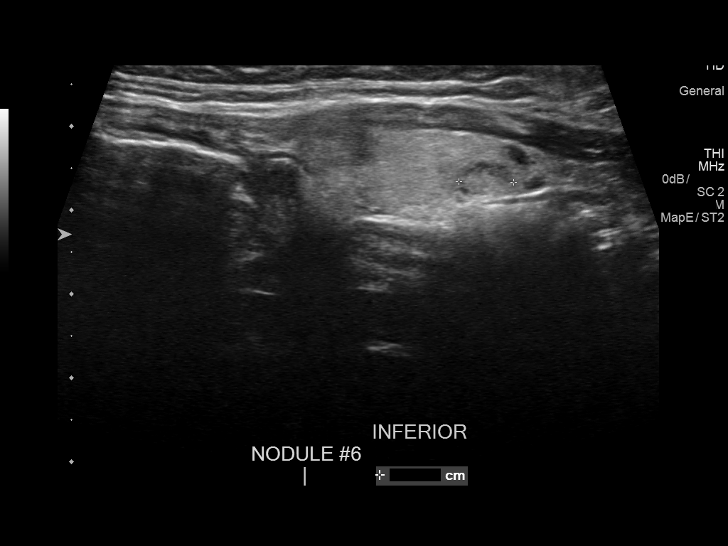

[13 of 25 positions shown; findings below may reference images not displayed]

FINDINGS: Parenchymal Echotexture: Mildly heterogenous

Isthmus: 0.4 cm, previously 0.2 cm

Right lobe: 5.7 x 1.3 x 2.0 cm, previously 5.2 x 1.2 x 2.4 cm

Left lobe: 4.5 x 1.8 x 2.0 cm, previously 4.9 x 1.4 x 2.5 cm

_________________________________________________________

Estimated total number of nodules >/= 1 cm: 2

Number of spongiform nodules >/=  2 cm not described below (TR1): 0

Number of mixed cystic and solid nodules >/= 1.5 cm not described
below (TR2): 0

_________________________________________________________

Nodule # 2:

Prior biopsy: No

Location: Left; Superior

Maximum size: 1.4 cm; Other 2 dimensions: 1.2 x 1.0 cm, previously,
1.2 x 1.1 x 0.8 cm

Composition: solid/almost completely solid (2)

Echogenicity: isoechoic (1)

Shape: not taller-than-wide (0)

Margins: smooth (0)

Echogenic foci: none (0)

ACR TI-RADS total points: 3.

ACR TI-RADS risk category:  TR3 (3 points).

Significant change in size (>/= 20% in two dimensions and minimal
increase of 2 mm): No

Change in features: No

Change in ACR TI-RADS risk category: No

ACR TI-RADS recommendations:

Given size (<1.4 cm) and appearance, this nodule does NOT meet
TI-RADS criteria for biopsy or dedicated follow-up.

_________________________________________________________

Left mid nodule 3 measures 2.1 x 1.8 x 1.3 cm and previously
measured 2.3 x 1.8 x 1.1 cm. Biopsy was performed 09/22/2002.

All other nodules measure 0.7 cm or less and do not meet criteria
for biopsy nor follow-up.
IMPRESSION: Left mid nodule 3 is stable and previously underwent biopsy.
Correlation with prior biopsy results is recommended.

All other nodules do not meet criteria for biopsy nor follow-up.

The above is in keeping with the ACR TI-RADS recommendations - [HOSPITAL] 0996;[DATE].

## 2020-01-18 DIAGNOSIS — Z0000 Encounter for general adult medical examination without abnormal findings: Secondary | ICD-10-CM | POA: Diagnosis not present

## 2020-01-18 DIAGNOSIS — E669 Obesity, unspecified: Secondary | ICD-10-CM | POA: Diagnosis not present

## 2020-01-18 DIAGNOSIS — Z1331 Encounter for screening for depression: Secondary | ICD-10-CM | POA: Diagnosis not present

## 2020-01-18 DIAGNOSIS — Z131 Encounter for screening for diabetes mellitus: Secondary | ICD-10-CM | POA: Diagnosis not present

## 2020-01-18 DIAGNOSIS — Z13220 Encounter for screening for lipoid disorders: Secondary | ICD-10-CM | POA: Diagnosis not present

## 2020-01-30 DIAGNOSIS — R0981 Nasal congestion: Secondary | ICD-10-CM | POA: Diagnosis not present

## 2020-01-30 DIAGNOSIS — Z20828 Contact with and (suspected) exposure to other viral communicable diseases: Secondary | ICD-10-CM | POA: Diagnosis not present

## 2021-02-05 DIAGNOSIS — Z0000 Encounter for general adult medical examination without abnormal findings: Secondary | ICD-10-CM | POA: Diagnosis not present

## 2021-02-05 DIAGNOSIS — Z13220 Encounter for screening for lipoid disorders: Secondary | ICD-10-CM | POA: Diagnosis not present

## 2021-02-12 DIAGNOSIS — Z1331 Encounter for screening for depression: Secondary | ICD-10-CM | POA: Diagnosis not present

## 2021-02-12 DIAGNOSIS — Z0000 Encounter for general adult medical examination without abnormal findings: Secondary | ICD-10-CM | POA: Diagnosis not present

## 2021-02-12 DIAGNOSIS — Z6835 Body mass index (BMI) 35.0-35.9, adult: Secondary | ICD-10-CM | POA: Diagnosis not present

## 2021-02-12 DIAGNOSIS — E669 Obesity, unspecified: Secondary | ICD-10-CM | POA: Diagnosis not present

## 2021-03-20 DIAGNOSIS — F331 Major depressive disorder, recurrent, moderate: Secondary | ICD-10-CM | POA: Diagnosis not present

## 2021-03-20 DIAGNOSIS — I10 Essential (primary) hypertension: Secondary | ICD-10-CM | POA: Diagnosis not present

## 2021-03-20 DIAGNOSIS — F411 Generalized anxiety disorder: Secondary | ICD-10-CM | POA: Diagnosis not present

## 2021-03-20 DIAGNOSIS — G4700 Insomnia, unspecified: Secondary | ICD-10-CM | POA: Diagnosis not present

## 2021-04-29 DIAGNOSIS — I10 Essential (primary) hypertension: Secondary | ICD-10-CM | POA: Diagnosis not present

## 2021-04-29 DIAGNOSIS — R5383 Other fatigue: Secondary | ICD-10-CM | POA: Diagnosis not present

## 2021-05-02 DIAGNOSIS — G4700 Insomnia, unspecified: Secondary | ICD-10-CM | POA: Diagnosis not present

## 2021-05-02 DIAGNOSIS — I10 Essential (primary) hypertension: Secondary | ICD-10-CM | POA: Diagnosis not present

## 2021-05-02 DIAGNOSIS — F331 Major depressive disorder, recurrent, moderate: Secondary | ICD-10-CM | POA: Diagnosis not present

## 2021-05-02 DIAGNOSIS — F411 Generalized anxiety disorder: Secondary | ICD-10-CM | POA: Diagnosis not present

## 2021-05-15 DIAGNOSIS — Z6836 Body mass index (BMI) 36.0-36.9, adult: Secondary | ICD-10-CM | POA: Diagnosis not present

## 2021-05-15 DIAGNOSIS — Z1231 Encounter for screening mammogram for malignant neoplasm of breast: Secondary | ICD-10-CM | POA: Diagnosis not present

## 2021-05-15 DIAGNOSIS — Z01419 Encounter for gynecological examination (general) (routine) without abnormal findings: Secondary | ICD-10-CM | POA: Diagnosis not present

## 2021-06-03 ENCOUNTER — Institutional Professional Consult (permissible substitution): Payer: BC Managed Care – PPO | Admitting: Neurology

## 2021-06-12 DIAGNOSIS — G4700 Insomnia, unspecified: Secondary | ICD-10-CM | POA: Diagnosis not present

## 2021-06-12 DIAGNOSIS — F331 Major depressive disorder, recurrent, moderate: Secondary | ICD-10-CM | POA: Diagnosis not present

## 2021-06-12 DIAGNOSIS — I10 Essential (primary) hypertension: Secondary | ICD-10-CM | POA: Diagnosis not present

## 2021-06-12 DIAGNOSIS — F411 Generalized anxiety disorder: Secondary | ICD-10-CM | POA: Diagnosis not present

## 2021-07-16 DIAGNOSIS — E559 Vitamin D deficiency, unspecified: Secondary | ICD-10-CM | POA: Diagnosis not present

## 2021-07-16 DIAGNOSIS — K5900 Constipation, unspecified: Secondary | ICD-10-CM | POA: Diagnosis not present

## 2021-07-16 DIAGNOSIS — R739 Hyperglycemia, unspecified: Secondary | ICD-10-CM | POA: Diagnosis not present

## 2021-07-16 DIAGNOSIS — E162 Hypoglycemia, unspecified: Secondary | ICD-10-CM | POA: Diagnosis not present

## 2021-07-16 DIAGNOSIS — I10 Essential (primary) hypertension: Secondary | ICD-10-CM | POA: Diagnosis not present

## 2021-07-17 ENCOUNTER — Institutional Professional Consult (permissible substitution): Payer: BC Managed Care – PPO | Admitting: Neurology

## 2021-07-23 DIAGNOSIS — E559 Vitamin D deficiency, unspecified: Secondary | ICD-10-CM | POA: Diagnosis not present

## 2021-07-23 DIAGNOSIS — E669 Obesity, unspecified: Secondary | ICD-10-CM | POA: Diagnosis not present

## 2021-07-23 DIAGNOSIS — I10 Essential (primary) hypertension: Secondary | ICD-10-CM | POA: Diagnosis not present

## 2021-07-23 DIAGNOSIS — R7303 Prediabetes: Secondary | ICD-10-CM | POA: Diagnosis not present

## 2021-09-10 DIAGNOSIS — J069 Acute upper respiratory infection, unspecified: Secondary | ICD-10-CM | POA: Diagnosis not present

## 2021-09-10 DIAGNOSIS — R051 Acute cough: Secondary | ICD-10-CM | POA: Diagnosis not present

## 2021-09-12 ENCOUNTER — Encounter: Payer: Self-pay | Admitting: *Deleted

## 2021-09-16 ENCOUNTER — Institutional Professional Consult (permissible substitution): Payer: BC Managed Care – PPO | Admitting: Neurology

## 2021-10-17 DIAGNOSIS — R7303 Prediabetes: Secondary | ICD-10-CM | POA: Diagnosis not present

## 2021-10-17 DIAGNOSIS — E559 Vitamin D deficiency, unspecified: Secondary | ICD-10-CM | POA: Diagnosis not present

## 2021-10-17 DIAGNOSIS — I10 Essential (primary) hypertension: Secondary | ICD-10-CM | POA: Diagnosis not present

## 2021-10-22 DIAGNOSIS — E669 Obesity, unspecified: Secondary | ICD-10-CM | POA: Diagnosis not present

## 2021-10-22 DIAGNOSIS — R7303 Prediabetes: Secondary | ICD-10-CM | POA: Diagnosis not present

## 2021-10-22 DIAGNOSIS — E559 Vitamin D deficiency, unspecified: Secondary | ICD-10-CM | POA: Diagnosis not present

## 2021-10-22 DIAGNOSIS — I10 Essential (primary) hypertension: Secondary | ICD-10-CM | POA: Diagnosis not present

## 2022-01-20 DIAGNOSIS — K219 Gastro-esophageal reflux disease without esophagitis: Secondary | ICD-10-CM | POA: Diagnosis not present

## 2022-01-20 DIAGNOSIS — G4700 Insomnia, unspecified: Secondary | ICD-10-CM | POA: Diagnosis not present

## 2022-01-20 DIAGNOSIS — F410 Panic disorder [episodic paroxysmal anxiety] without agoraphobia: Secondary | ICD-10-CM | POA: Diagnosis not present

## 2022-01-20 DIAGNOSIS — M545 Low back pain, unspecified: Secondary | ICD-10-CM | POA: Diagnosis not present

## 2022-02-03 DIAGNOSIS — I10 Essential (primary) hypertension: Secondary | ICD-10-CM | POA: Diagnosis not present

## 2022-02-03 DIAGNOSIS — E860 Dehydration: Secondary | ICD-10-CM | POA: Diagnosis not present

## 2022-02-05 DIAGNOSIS — Z0000 Encounter for general adult medical examination without abnormal findings: Secondary | ICD-10-CM | POA: Diagnosis not present

## 2022-02-14 DIAGNOSIS — I10 Essential (primary) hypertension: Secondary | ICD-10-CM | POA: Diagnosis not present

## 2022-02-14 DIAGNOSIS — K5900 Constipation, unspecified: Secondary | ICD-10-CM | POA: Diagnosis not present

## 2022-02-18 DIAGNOSIS — E782 Mixed hyperlipidemia: Secondary | ICD-10-CM | POA: Diagnosis not present

## 2022-02-19 DIAGNOSIS — I10 Essential (primary) hypertension: Secondary | ICD-10-CM | POA: Diagnosis not present

## 2022-02-19 DIAGNOSIS — Z0000 Encounter for general adult medical examination without abnormal findings: Secondary | ICD-10-CM | POA: Diagnosis not present

## 2022-02-19 DIAGNOSIS — R202 Paresthesia of skin: Secondary | ICD-10-CM | POA: Diagnosis not present

## 2022-02-19 DIAGNOSIS — R739 Hyperglycemia, unspecified: Secondary | ICD-10-CM | POA: Diagnosis not present

## 2022-02-19 DIAGNOSIS — R252 Cramp and spasm: Secondary | ICD-10-CM | POA: Diagnosis not present

## 2022-02-19 DIAGNOSIS — Z1211 Encounter for screening for malignant neoplasm of colon: Secondary | ICD-10-CM | POA: Diagnosis not present

## 2022-02-19 DIAGNOSIS — E860 Dehydration: Secondary | ICD-10-CM | POA: Diagnosis not present

## 2022-03-30 NOTE — Progress Notes (Unsigned)
No chief complaint on file.  History of Present Illness: 51 yo female with history of HTN, depression who is here today as a new consult, referred by ***, for the evaluation of elevated blood pressure and cardiac risk assessment.   Primary Care Physician: Yvonna Alanis, NP   Past Medical History:  Diagnosis Date   Depression    Hypertension    Insomnia    IUD (intrauterine device) in place    Meniere's disease    Panic attack    Thyroid disease     Past Surgical History:  Procedure Laterality Date   CESAREAN SECTION  2000, 2002   x2    Current Outpatient Medications  Medication Sig Dispense Refill   ALPRAZolam (XANAX) 0.25 MG tablet Take 0.25 mg by mouth at bedtime as needed for anxiety.     clonazePAM (KLONOPIN) 0.5 MG tablet Take 0.5 mg by mouth 2 (two) times daily as needed.     docusate sodium (COLACE) 100 MG capsule Take 100 mg by mouth 2 (two) times daily.     levonorgestrel (MIRENA) 20 MCG/24HR IUD 1 each by Intrauterine route once.     losartan-hydrochlorothiazide (HYZAAR) 100-25 MG tablet losartan 100 mg-hydrochlorothiazide 25 mg tablet   1 tablet every day by oral route.     PARoxetine (PAXIL) 10 MG tablet Take 10 mg by mouth daily.     sertraline (ZOLOFT) 25 MG tablet Take 25 mg by mouth daily.     triamterene-hydrochlorothiazide (MAXZIDE) 75-50 MG tablet Take by mouth.     No current facility-administered medications for this visit.    Allergies  Allergen Reactions   Shrimp [Shellfish Allergy] Anaphylaxis   Penicillins Rash    Social History   Socioeconomic History   Marital status: Married    Spouse name: Not on file   Number of children: Not on file   Years of education: Not on file   Highest education level: Not on file  Occupational History   Not on file  Tobacco Use   Smoking status: Never   Smokeless tobacco: Not on file  Substance and Sexual Activity   Alcohol use: No   Drug use: No   Sexual activity: Not on file  Other Topics  Concern   Not on file  Social History Narrative   Caffeine 2 diet soda, 1 unsweet tea.  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    Social Determinants of Health   Financial Resource Strain: Not on file  Food Insecurity: Not on file  Transportation Needs: Not on file  Physical Activity: Not on file  Stress: Not on file  Social Connections: Not on file  Intimate Partner Violence: Not on file    Family History  Problem Relation Age of Onset   Stroke Father    Hypertension Father    Hypertension Sister    Hypertension Maternal Grandmother    Cancer Maternal Grandmother        Ovarian Cancer   Hypertension Maternal Grandfather    Cancer Maternal Grandfather        Lung Cancer   Hypertension Paternal Grandmother    Cancer Paternal Grandmother        breast cancer   Diabetes Paternal Grandmother    Hypertension Paternal Grandfather    Heart disease Other    Hypertension Other    Cancer Other     Review of Systems:  As stated in the HPI and otherwise negative.   There were no  vitals taken for this visit.  Physical Examination: General: Well developed, well nourished, NAD  HEENT: OP clear, mucus membranes moist  SKIN: warm, dry. No rashes. Neuro: No focal deficits  Musculoskeletal: Muscle strength 5/5 all ext  Psychiatric: Mood and affect normal  Neck: No JVD, no carotid bruits, no thyromegaly, no lymphadenopathy.  Lungs:Clear bilaterally, no wheezes, rhonci, crackles Cardiovascular: Regular rate and rhythm. No murmurs, gallops or rubs. Abdomen:Soft. Bowel sounds present. Non-tender.  Extremities: No lower extremity edema. Pulses are 2 + in the bilateral DP/PT.  EKG:  EKG {ACTION; IS/IS HQI:[id number]} ordered today. The ekg ordered today demonstrates ***  Recent Labs: No results found for requested labs within last 365 days.   Lipid Panel No results found for: "CHOL", "TRIG", "HDL", "CHOLHDL", "VLDL", "LDLCALC", "LDLDIRECT"   Wt Readings from  Last 3 Encounters:  11/08/13 223 lb (101.2 kg)  09/29/12 228 lb 3.2 oz (103.5 kg)  09/17/11 208 lb 6.4 oz (94.5 kg)      Assessment and Plan:   1.   Labs/ tests ordered today include:  No orders of the defined types were placed in this encounter.    Disposition:   F/U with me in ***    Signed, Verne Carrow, MD, [facility] 03/30/2022 12:58 PM    [facility] [address] [place], [place], [place]  [address] Phone: [phone]; Fax: [phone]

## 2022-03-31 ENCOUNTER — Ambulatory Visit: Payer: BC Managed Care – PPO | Attending: Cardiovascular Disease | Admitting: Cardiovascular Disease

## 2022-03-31 ENCOUNTER — Encounter: Payer: Self-pay | Admitting: Cardiovascular Disease

## 2022-03-31 VITALS — BP 148/88 | HR 70 | Ht 67.0 in | Wt 226.4 lb

## 2022-03-31 DIAGNOSIS — I10 Essential (primary) hypertension: Secondary | ICD-10-CM

## 2022-03-31 DIAGNOSIS — Z7189 Other specified counseling: Secondary | ICD-10-CM

## 2022-03-31 MED ORDER — HYDROCHLOROTHIAZIDE 25 MG PO TABS: 25.0000 mg | ORAL_TABLET | Freq: Every day | ORAL | 3 refills | Status: AC

## 2022-03-31 NOTE — Patient Instructions (Signed)
Medication Instructions:  Your physician has recommended you make the following change in your medication:  1.) start hydrochlorothiazide (hctz) 25 mg - take one tablet daily   *If you need a refill on your cardiac medications before your next appointment, please call your pharmacy*   Lab Work: none   Testing/Procedures: Your physician has requested that you have an echocardiogram. Echocardiography is a painless test that uses sound waves to create images of your heart. It provides your doctor with information about the size and shape of your heart and how well your heart's chambers and valves are working. This procedure takes approximately one hour. There are no restrictions for this procedure. Please do NOT wear cologne, perfume, aftershave, or lotions (deodorant is allowed). Please arrive 15 minutes prior to your appointment time.  Calcium Score CT Scan - $95 charge  Follow-Up: At [facility], you and your health needs are our priority.  As part of our continuing mission to provide you with exceptional heart care, we have created designated Provider Care Teams.  These Care Teams include your primary Cardiologist (physician) and Advanced Practice Providers (APPs -  Physician Assistants and Nurse Practitioners) who all work together to provide you with the care you need, when you need it.   Your next appointment:   12 month(s)  The format for your next appointment:   In Person  Provider:   Lauree Chandler, MD   Important Information About Sugar

## 2022-04-10 ENCOUNTER — Other Ambulatory Visit: Payer: Self-pay | Admitting: *Deleted

## 2022-04-10 DIAGNOSIS — Z7189 Other specified counseling: Secondary | ICD-10-CM

## 2022-04-10 NOTE — Progress Notes (Signed)
Re entered order for calcium score ct.

## 2022-05-28 ENCOUNTER — Ambulatory Visit (HOSPITAL_BASED_OUTPATIENT_CLINIC_OR_DEPARTMENT_OTHER)
Admission: RE | Admit: 2022-05-28 | Discharge: 2022-05-28 | Disposition: A | Discharge status: Home / Self Care | Payer: BC Managed Care – PPO | Source: Ambulatory Visit | Attending: Cardiovascular Disease | Admitting: Cardiovascular Disease

## 2022-05-28 ENCOUNTER — Ambulatory Visit (INDEPENDENT_AMBULATORY_CARE_PROVIDER_SITE_OTHER): Payer: BC Managed Care – PPO

## 2022-05-28 DIAGNOSIS — Z7189 Other specified counseling: Secondary | ICD-10-CM | POA: Diagnosis not present

## 2022-05-28 DIAGNOSIS — Z01419 Encounter for gynecological examination (general) (routine) without abnormal findings: Secondary | ICD-10-CM | POA: Diagnosis not present

## 2022-05-28 DIAGNOSIS — N951 Menopausal and female climacteric states: Secondary | ICD-10-CM | POA: Diagnosis not present

## 2022-05-28 DIAGNOSIS — Z6836 Body mass index (BMI) 36.0-36.9, adult: Secondary | ICD-10-CM | POA: Diagnosis not present

## 2022-05-28 DIAGNOSIS — I10 Essential (primary) hypertension: Secondary | ICD-10-CM

## 2022-05-28 DIAGNOSIS — Z124 Encounter for screening for malignant neoplasm of cervix: Secondary | ICD-10-CM | POA: Diagnosis not present

## 2022-05-28 DIAGNOSIS — Z1231 Encounter for screening mammogram for malignant neoplasm of breast: Secondary | ICD-10-CM | POA: Diagnosis not present

## 2022-05-28 LAB — ECHOCARDIOGRAM COMPLETE
Area-P 1/2: 3.91 cm2
S' Lateral: 2.34 cm

## 2022-07-15 DIAGNOSIS — J069 Acute upper respiratory infection, unspecified: Secondary | ICD-10-CM | POA: Diagnosis not present

## 2022-08-07 DIAGNOSIS — L821 Other seborrheic keratosis: Secondary | ICD-10-CM | POA: Diagnosis not present

## 2022-08-07 DIAGNOSIS — D485 Neoplasm of uncertain behavior of skin: Secondary | ICD-10-CM | POA: Diagnosis not present

## 2022-08-07 DIAGNOSIS — D225 Melanocytic nevi of trunk: Secondary | ICD-10-CM | POA: Diagnosis not present

## 2022-08-07 DIAGNOSIS — D229 Melanocytic nevi, unspecified: Secondary | ICD-10-CM | POA: Diagnosis not present

## 2022-08-07 DIAGNOSIS — L578 Other skin changes due to chronic exposure to nonionizing radiation: Secondary | ICD-10-CM | POA: Diagnosis not present

## 2022-08-28 DIAGNOSIS — H8109 Meniere's disease, unspecified ear: Secondary | ICD-10-CM | POA: Insufficient documentation

## 2022-08-28 DIAGNOSIS — F419 Anxiety disorder, unspecified: Secondary | ICD-10-CM | POA: Diagnosis not present

## 2022-08-28 DIAGNOSIS — Z1330 Encounter for screening examination for mental health and behavioral disorders, unspecified: Secondary | ICD-10-CM | POA: Diagnosis not present

## 2022-08-28 DIAGNOSIS — Z130 Encounter for screening for diseases of the blood and blood-forming organs and certain disorders involving the immune mechanism: Secondary | ICD-10-CM | POA: Diagnosis not present

## 2022-08-28 DIAGNOSIS — Z79899 Other long term (current) drug therapy: Secondary | ICD-10-CM | POA: Diagnosis not present

## 2022-08-28 DIAGNOSIS — E559 Vitamin D deficiency, unspecified: Secondary | ICD-10-CM | POA: Diagnosis not present

## 2022-08-28 DIAGNOSIS — I10 Essential (primary) hypertension: Secondary | ICD-10-CM | POA: Diagnosis not present

## 2022-08-28 DIAGNOSIS — E049 Nontoxic goiter, unspecified: Secondary | ICD-10-CM | POA: Diagnosis not present

## 2022-09-01 DIAGNOSIS — D485 Neoplasm of uncertain behavior of skin: Secondary | ICD-10-CM | POA: Diagnosis not present

## 2022-09-16 ENCOUNTER — Encounter: Payer: Self-pay | Admitting: Internal Medicine

## 2022-09-26 DIAGNOSIS — E042 Nontoxic multinodular goiter: Secondary | ICD-10-CM | POA: Diagnosis not present

## 2022-09-29 ENCOUNTER — Ambulatory Visit (AMBULATORY_SURGERY_CENTER): Payer: BC Managed Care – PPO | Admitting: *Deleted

## 2022-09-29 ENCOUNTER — Encounter: Payer: Self-pay | Admitting: Internal Medicine

## 2022-09-29 VITALS — Ht 67.0 in | Wt 230.0 lb

## 2022-09-29 DIAGNOSIS — Z1211 Encounter for screening for malignant neoplasm of colon: Secondary | ICD-10-CM

## 2022-09-29 MED ORDER — NA SULFATE-K SULFATE-MG SULF 17.5-3.13-1.6 GM/177ML PO SOLN: 1.0000 | Freq: Once | ORAL | 0 refills | Status: AC

## 2022-09-29 NOTE — Progress Notes (Signed)

## 2022-10-20 ENCOUNTER — Encounter: Payer: BC Managed Care – PPO | Admitting: Internal Medicine

## 2022-10-22 NOTE — Progress Notes (Unsigned)
 @  Patient ID: Mia Vasquez, female    DOB: 27-Feb-1971, 52 y.o.   MRN: [id number]  No chief complaint on file.   Referring provider: Lewis Moccasin, MD  HPI: 52 year old female, never smoked. PMH significant for HTN, multinodular goiter, meniere disease, obesity.    10/23/2022 Patient presents today for sleep consult.      Sleep questionnaire Symptoms-    Prior sleep study-  Bedtime- Time to fall asleep-  Nocturnal awakenings-  Out of bed/start of day-  Weight changes-  Do you operate heavy machinery-  Do you currently wear CPAP-  Do you current wear oxygen-  Epworth-      Allergies  Allergen Reactions   Shrimp [Shellfish Allergy] Anaphylaxis   Penicillins Rash    Immunization History  Administered Date(s) Administered   PFIZER(Purple Top)SARS-COV-2 Vaccination 01/24/2020, 05/03/2020, 10/11/2020    Past Medical History:  Diagnosis Date   Allergy    SEASONAL   Depression    GERD (gastroesophageal reflux disease)    Hypertension    Insomnia    IUD (intrauterine device) in place    Meniere's disease    Panic attack    Sleep apnea    Thyroid disease     Tobacco History: Social History   Tobacco Use  Smoking Status Never  Smokeless Tobacco Never   Counseling given: Not Answered   Outpatient Medications Prior to Visit  Medication Sig Dispense Refill   ALPRAZolam (XANAX) 0.25 MG tablet Take 0.25 mg by mouth at bedtime as needed for anxiety.     Cholecalciferol (VITAMIN D-3 PO) Take by mouth daily. TAKE 1500 UNITS DAILY     EPINEPHrine 0.3 mg/0.3 mL IJ SOAJ injection Inject into the muscle. (Patient not taking: Reported on 09/29/2022)     hydrochlorothiazide (HYDRODIURIL) 25 MG tablet Take 1 tablet (25 mg total) by mouth daily. 90 tablet 3   levonorgestrel (MIRENA) 20 MCG/24HR IUD 1 each by Intrauterine route once.     losartan (COZAAR) 100 MG tablet Take 100 mg by mouth daily.     sertraline (ZOLOFT) 25 MG tablet Take 25 mg by mouth daily.      No facility-administered medications prior to visit.      Review of Systems  Review of Systems   Physical Exam  There were no vitals taken for this visit. Physical Exam   Lab Results:  CBC No results found for: "WBC", "RBC", "HGB", "HCT", "PLT", "MCV", "MCH", "MCHC", "RDW", "LYMPHSABS", "MONOABS", "EOSABS", "BASOSABS"  BMET No results found for: "NA", "K", "CL", "CO2", "GLUCOSE", "BUN", "CREATININE", "CALCIUM", "GFRNONAA", "GFRAA"  BNP No results found for: "BNP"  ProBNP No results found for: "PROBNP"  Imaging: No results found.   Assessment & Plan:   No problem-specific Assessment & Plan notes found for this encounter.     Glenford Bayley, NP 10/22/2022

## 2022-10-22 NOTE — Patient Instructions (Signed)

## 2022-10-23 ENCOUNTER — Encounter: Payer: Self-pay | Admitting: Primary Care

## 2022-10-23 ENCOUNTER — Ambulatory Visit: Payer: BC Managed Care – PPO | Admitting: Primary Care

## 2022-10-23 VITALS — BP 108/78 | HR 71 | Temp 98.4°F | Ht 67.0 in | Wt 235.0 lb

## 2022-10-23 DIAGNOSIS — R0683 Snoring: Secondary | ICD-10-CM

## 2022-10-23 DIAGNOSIS — Z8669 Personal history of other diseases of the nervous system and sense organs: Secondary | ICD-10-CM | POA: Diagnosis not present

## 2022-10-23 NOTE — Assessment & Plan Note (Signed)
-   Patient had sleep study 20 years ago that showed mild OSA, insurance would not cover CPAP. She continues to wake up gasping for air several times a week. Associated snoring symptoms and daytime fatigue. Weight is up 10 lbs. BMI 36. Epworth score 6/24.  Patient needs updated home sleep study to reevaluate for OSA.  We reviewed risk of untreated sleep apnea including cardiac arrhythmias, pulmonary hypertension, diabetes and stroke.  We discussed treatment options including weight loss, oral appliance, CPAP therapy referral to ENT for possible surgical options.  Encourage side sleeping position.  Advised against driving if experiencing excessive daytime sleepiness fatigue.  Follow-up 1 to 2 weeks after sleep study review results and treatment options if needed.

## 2022-10-23 NOTE — Progress Notes (Signed)
Reviewed and agree with assessment/plan.   Luevenia Mcavoy, MD [facility] 10/23/2022, 10:48 AM Pager:  [phone]  

## 2022-11-18 ENCOUNTER — Ambulatory Visit (AMBULATORY_SURGERY_CENTER): Payer: BC Managed Care – PPO | Admitting: Internal Medicine

## 2022-11-18 ENCOUNTER — Encounter: Payer: Self-pay | Admitting: Internal Medicine

## 2022-11-18 VITALS — BP 115/68 | HR 74 | Temp 98.0°F | Resp 17 | Ht 67.0 in | Wt 230.0 lb

## 2022-11-18 DIAGNOSIS — D125 Benign neoplasm of sigmoid colon: Secondary | ICD-10-CM | POA: Diagnosis not present

## 2022-11-18 DIAGNOSIS — K635 Polyp of colon: Secondary | ICD-10-CM | POA: Diagnosis not present

## 2022-11-18 DIAGNOSIS — Z86010 Personal history of colonic polyps: Secondary | ICD-10-CM | POA: Insufficient documentation

## 2022-11-18 DIAGNOSIS — Z860101 Personal history of adenomatous and serrated colon polyps: Secondary | ICD-10-CM

## 2022-11-18 DIAGNOSIS — Z1211 Encounter for screening for malignant neoplasm of colon: Secondary | ICD-10-CM | POA: Diagnosis not present

## 2022-11-18 DIAGNOSIS — D120 Benign neoplasm of cecum: Secondary | ICD-10-CM | POA: Diagnosis not present

## 2022-11-18 HISTORY — DX: Personal history of adenomatous and serrated colon polyps: Z86.0101

## 2022-11-18 HISTORY — PX: COLONOSCOPY: SHX174

## 2022-11-18 MED ORDER — SODIUM CHLORIDE 0.9 % IV SOLN: 500.0000 mL | Freq: Once | INTRAVENOUS | Status: DC

## 2022-11-18 NOTE — Progress Notes (Signed)
Uneventful anesthetic. Report to pacu rn. Vss. Care resumed by rn. 

## 2022-11-18 NOTE — Op Note (Addendum)
[facility] Patient Name: Mia Vasquez Procedure Date: 11/18/2022 10:14 AM MRN: [id number] Endoscopist: Iva Boop , MD, [id number] Age: 52 Referring MD:  Date of Birth: 12/15/1970 Gender: Female Account #: [id number] Procedure:                Colonoscopy Indications:              Screening for colorectal malignant neoplasm, This                            is the patient's first colonoscopy Medicines:                Monitored Anesthesia Care Procedure:                Pre-Anesthesia Assessment:                           - Prior to the procedure, a History and Physical                            was performed, and patient medications and                            allergies were reviewed. The patient's tolerance of                            previous anesthesia was also reviewed. The risks                            and benefits of the procedure and the sedation                            options and risks were discussed with the patient.                            All questions were answered, and informed consent                            was obtained. Prior Anticoagulants: The patient has                            taken no anticoagulant or antiplatelet agents. ASA                            Grade Assessment: II - A patient with mild systemic                            disease. After reviewing the risks and benefits,                            the patient was deemed in satisfactory condition to                            undergo the procedure.  After obtaining informed consent, the colonoscope                            was passed under direct vision. Throughout the                            procedure, the patient's blood pressure, pulse, and                            oxygen saturations were monitored continuously. The                            Olympus [id number]-[id number]) Colonoscope was                            introduced through the anus  and advanced to the the                            cecum, identified by appendiceal orifice and                            ileocecal valve. The colonoscopy was performed                            without difficulty. The patient tolerated the                            procedure well. The quality of the bowel                            preparation was good. The ileocecal valve,                            appendiceal orifice, and rectum were photographed.                            The bowel preparation used was SUPREP via split                            dose instruction. Scope In: 10:28:44 AM Scope Out: 10:42:09 AM Scope Withdrawal Time: 0 hours 9 minutes 50 seconds  Total Procedure Duration: 0 hours 13 minutes 25 seconds  Findings:                 The perianal and digital rectal examinations were                            normal.                           Two sessile polyps were found in the sigmoid colon                            and cecum. The polyps were diminutive in size.  These polyps were removed with a cold snare.                            Resection and retrieval were complete. Verification                            of patient identification for the specimen was                            done. Estimated blood loss was minimal.                           The exam was otherwise without abnormality on                            direct and retroflexion views. Complications:            No immediate complications. Estimated Blood Loss:     Estimated blood loss was minimal. Impression:               - Two diminutive polyps in the sigmoid colon and in                            the cecum, removed with a cold snare. Resected and                            retrieved.                           - The examination was otherwise normal on direct                            and retroflexion views. Recommendation:           - Patient has a contact number available  for                            emergencies. The signs and symptoms of potential                            delayed complications were discussed with the                            patient. Return to normal activities tomorrow.                            Written discharge instructions were provided to the                            patient.                           - Resume previous diet.                           - Continue present medications.                           -  Repeat colonoscopy is recommended. The                            colonoscopy date will be determined after pathology                            results from today's exam become available for                            review.                           CC Billee Cashing PA-C Iva Boop, MD 11/18/2022 10:50:42 AM This report has been signed electronically.

## 2022-11-18 NOTE — Progress Notes (Signed)
[facility] History and Physical   Primary Care Physician:  Lewis Moccasin, MD   Reason for Procedure:   Colon cancer screening  Plan:    colonoscopy     HPI: Mia Vasquez is a 52 y.o. female here for screening exam   Past Medical History:  Diagnosis Date   Allergy    SEASONAL   Depression    GERD (gastroesophageal reflux disease)    Hypertension    Insomnia    IUD (intrauterine device) in place    Meniere's disease    Panic attack    Sleep apnea    Thyroid disease     Past Surgical History:  Procedure Laterality Date   CESAREAN SECTION  2000, 2002   x2    Prior to Admission medications   Medication Sig Start Date End Date Taking? Authorizing Provider  Cholecalciferol (VITAMIN D-3 PO) Take by mouth daily. TAKE 1500 UNITS DAILY   Yes [provider]  hydrochlorothiazide (HYDRODIURIL) 25 MG tablet Take 1 tablet (25 mg total) by mouth daily. 03/31/22  Yes Kathleene Hazel, MD  levonorgestrel (MIRENA) 20 MCG/24HR IUD 1 each by Intrauterine route once.   Yes [provider]  losartan (COZAAR) 100 MG tablet Take 100 mg by mouth daily. 02/03/22  Yes [provider]  sertraline (ZOLOFT) 25 MG tablet Take 25 mg by mouth daily.   Yes [provider]  ALPRAZolam (XANAX) 0.25 MG tablet Take 0.25 mg by mouth at bedtime as needed for anxiety.    [provider]  EPINEPHrine 0.3 mg/0.3 mL IJ SOAJ injection Inject into the muscle. Patient not taking: Reported on 09/29/2022 08/28/22   [provider]    Current Outpatient Medications  Medication Sig Dispense Refill   Cholecalciferol (VITAMIN D-3 PO) Take by mouth daily. TAKE 1500 UNITS DAILY     hydrochlorothiazide (HYDRODIURIL) 25 MG tablet Take 1 tablet (25 mg total) by mouth daily. 90 tablet 3   levonorgestrel (MIRENA) 20 MCG/24HR IUD 1 each by Intrauterine route once.     losartan (COZAAR) 100 MG tablet Take 100 mg by mouth daily.     sertraline  (ZOLOFT) 25 MG tablet Take 25 mg by mouth daily.     ALPRAZolam (XANAX) 0.25 MG tablet Take 0.25 mg by mouth at bedtime as needed for anxiety.     EPINEPHrine 0.3 mg/0.3 mL IJ SOAJ injection Inject into the muscle. (Patient not taking: Reported on 09/29/2022)     Current Facility-Administered Medications  Medication Dose Route Frequency Provider Last Rate Last Admin   0.9 %  sodium chloride infusion  500 mL Intravenous Once Iva Boop, MD        Allergies as of 11/18/2022 - Review Complete 11/18/2022  Allergen Reaction Noted   Shrimp [shellfish allergy] Anaphylaxis 08/11/2011   Penicillins Rash 02/15/2015   Wound dressing adhesive Rash 10/23/2022    Family History  Problem Relation Age of Onset   Hypertension Mother    Stroke Father    Hypertension Father    Hypertension Sister    Hypertension Maternal Grandmother    Cancer Maternal Grandmother        Ovarian Cancer   Hypertension Maternal Grandfather    Cancer Maternal Grandfather        Lung Cancer   Hypertension Paternal Grandmother    Cancer Paternal Grandmother        breast cancer   Diabetes Paternal Grandmother    Hypertension Paternal Grandfather    Heart disease  Other    Hypertension Other    Cancer Other    Colon cancer Neg Hx    Colon polyps Neg Hx    Crohn's disease Neg Hx    Esophageal cancer Neg Hx    Rectal cancer Neg Hx    Stomach cancer Neg Hx    Ulcerative colitis Neg Hx     Social History   Socioeconomic History   Marital status: Married    Spouse name: Not on file   Number of children: 2   Years of education: Not on file   Highest education level: Not on file  Occupational History   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    Social Determinants of Health   Financial Resource Strain: Not on file  Food Insecurity: Not on file  Transportation Needs: Not on file  Physical Activity: Not on file  Stress: Not on file  Social Connections: Not on file  Intimate Partner Violence: Not on file    Review of Systems:  All other review of systems negative except as mentioned in the HPI.  Physical Exam: Vital signs BP 96/76   Pulse 81   Temp 98 F (36.7 C)   Resp 13   Ht 5\' 7"  (1.702 m)   Wt 230 lb (104.3 kg)   SpO2 99%   BMI 36.02 kg/m   General:   Alert,  Well-developed, well-nourished, pleasant and cooperative in NAD Lungs:  Clear throughout to auscultation.   Heart:  Regular rate and rhythm; no murmurs, clicks, rubs,  or gallops. Abdomen:  Soft, nontender and nondistended. Normal bowel sounds.   Neuro/Psych:  Alert and cooperative. Normal mood and affect. A and O x 3   [name]  Sena Slate, MD, [facility] [phone] (pager) 11/18/2022 10:20 AM@

## 2022-11-18 NOTE — Progress Notes (Signed)
Called to room to assist during endoscopic procedure.  Patient ID and intended procedure confirmed with present staff. Received instructions for my participation in the procedure from the performing physician.  

## 2022-11-18 NOTE — Patient Instructions (Addendum)
I found and removed 2 small colon polyps.  I will let you know pathology results and when to have another routine colonoscopy by mail and/or My Chart.  I appreciate the opportunity to care for you. Iva Boop, MD, [facility]   Thank you for letting us take care of your healthcare needs today. Please see handouts given to you on Polyps.   YOU HAD AN ENDOSCOPIC PROCEDURE TODAY AT THE [facility]:   Refer to the procedure report that was given to you for any specific questions about what was found during the examination.  If the procedure report does not answer your questions, please call your gastroenterologist to clarify.  If you requested that your care partner not be given the details of your procedure findings, then the procedure report has been included in a sealed envelope for you to review at your convenience later.  YOU SHOULD EXPECT: Some feelings of bloating in the abdomen. Passage of more gas than usual.  Walking can help get rid of the air that was put into your GI tract during the procedure and reduce the bloating. If you had a lower endoscopy (such as a colonoscopy or flexible sigmoidoscopy) you may notice spotting of blood in your stool or on the toilet paper. If you underwent a bowel prep for your procedure, you may not have a normal bowel movement for a few days.  Please Note:  You might notice some irritation and congestion in your nose or some drainage.  This is from the oxygen used during your procedure.  There is no need for concern and it should clear up in a day or so.  SYMPTOMS TO REPORT IMMEDIATELY:  Following lower endoscopy (colonoscopy or flexible sigmoidoscopy):  Excessive amounts of blood in the stool  Significant tenderness or worsening of abdominal pains  Swelling of the abdomen that is new, acute  Fever of 100F or higher   For urgent or emergent issues, a gastroenterologist can be reached at any hour by calling [phone]. Do not use MyChart  messaging for urgent concerns.    DIET:  We do recommend a small meal at first, but then you may proceed to your regular diet.  Drink plenty of fluids but you should avoid alcoholic beverages for 24 hours.  ACTIVITY:  You should plan to take it easy for the rest of today and you should NOT DRIVE or use heavy machinery until tomorrow (because of the sedation medicines used during the test).    FOLLOW UP: Our staff will call the number listed on your records the next business day following your procedure.  We will call around 7:15- 8:00 am to check on you and address any questions or concerns that you may have regarding the information given to you following your procedure. If we do not reach you, we will leave a message.     If any biopsies were taken you will be contacted by phone or by letter within the next 1-3 weeks.  Please call us at [phone] if you have not heard about the biopsies in 3 weeks.    SIGNATURES/CONFIDENTIALITY: You and/or your care partner have signed paperwork which will be entered into your electronic medical record.  These signatures attest to the fact that that the information above on your After Visit Summary has been reviewed and is understood.  Full responsibility of the confidentiality of this discharge information lies with you and/or your care-partner.

## 2022-11-19 ENCOUNTER — Telehealth: Payer: Self-pay

## 2022-11-19 NOTE — Telephone Encounter (Signed)
  Follow up Call-     11/18/2022    9:51 AM  Call back number  Post procedure Call Back phone  # [phone]  Permission to leave phone message Yes     Patient questions:  Do you have a fever, pain , or abdominal swelling? No. Pain Score  0 *  Have you tolerated food without any problems? Yes.    Have you been able to return to your normal activities? Yes.    Do you have any questions about your discharge instructions: Diet   No. Medications  No. Follow up visit  No.  Do you have questions or concerns about your Care? No.  Actions: * If pain score is 4 or above: No action needed, pain <4.

## 2022-12-01 ENCOUNTER — Encounter: Payer: Self-pay | Admitting: Internal Medicine

## 2023-01-06 NOTE — Addendum Note (Signed)
Addended by: Glenford Bayley on: 01/06/2023 12:42 PM   Modules accepted: Orders

## 2023-01-13 NOTE — Telephone Encounter (Signed)
Patient was seen 10/23/22 order for HST was placed until 01/06/23. I spoke with Victorino Dike with [employer] she is going to check to see what is going on and call me back

## 2023-01-13 NOTE — Telephone Encounter (Signed)
[name] with [employer] called me back. They attempted to contact the patient on 7/25 a call and text and again a call on 01/09/23. Patient's voicemail was not setup and they couldn't leave a message. Mia Vasquez stated she would try to call both numbers from her personal phone in hopes to complete the registration

## 2023-01-21 DIAGNOSIS — G4730 Sleep apnea, unspecified: Secondary | ICD-10-CM | POA: Diagnosis not present

## 2023-02-05 ENCOUNTER — Encounter (INDEPENDENT_AMBULATORY_CARE_PROVIDER_SITE_OTHER): Payer: BC Managed Care – PPO

## 2023-02-05 DIAGNOSIS — R0683 Snoring: Secondary | ICD-10-CM

## 2023-02-05 DIAGNOSIS — G4733 Obstructive sleep apnea (adult) (pediatric): Secondary | ICD-10-CM | POA: Diagnosis not present

## 2023-02-05 DIAGNOSIS — Z8669 Personal history of other diseases of the nervous system and sense organs: Secondary | ICD-10-CM

## 2023-03-02 DIAGNOSIS — Z79899 Other long term (current) drug therapy: Secondary | ICD-10-CM | POA: Diagnosis not present

## 2023-03-02 DIAGNOSIS — I10 Essential (primary) hypertension: Secondary | ICD-10-CM | POA: Diagnosis not present

## 2023-03-02 DIAGNOSIS — F419 Anxiety disorder, unspecified: Secondary | ICD-10-CM | POA: Diagnosis not present

## 2023-03-02 DIAGNOSIS — E042 Nontoxic multinodular goiter: Secondary | ICD-10-CM | POA: Diagnosis not present

## 2023-03-11 ENCOUNTER — Encounter: Payer: Self-pay | Admitting: Primary Care

## 2023-03-11 ENCOUNTER — Telehealth: Payer: BC Managed Care – PPO | Admitting: Primary Care

## 2023-03-11 DIAGNOSIS — G4730 Sleep apnea, unspecified: Secondary | ICD-10-CM

## 2023-03-11 NOTE — Progress Notes (Signed)
Reviewed and agree with assessment/plan.   Coralyn Helling, MD [facility] Pulmonary/Critical Care 03/11/2023, 1:14 PM Pager:  [phone]

## 2023-03-11 NOTE — Patient Instructions (Signed)

## 2023-03-11 NOTE — Progress Notes (Signed)
Virtual Visit via Video Note  I connected with Mia Vasquez on 03/11/23 at  8:30 AM EDT by a video enabled telemedicine application and verified that I am speaking with the correct person using two identifiers.  Location: Patient: Home Provider: Office    I discussed the limitations of evaluation and management by telemedicine and the availability of in person appointments. The patient expressed understanding and agreed to proceed.  History of Present Illness: 52 year old female, never smoked. PMH significant for HTN, multinodular goiter, meniere disease, obesity.   Previous [facility] pulmonary encounter:  10/23/2022 Patient presents today for sleep consult. She had sleep study 20 years ago and reports she had mild OSA but insurance would not cover CPAP.  She reports waking up gasping for air several nights a week. She does snore. Unable to sleep on her back. Associated daytime fatigue. Dentist concerned about sleep apnea.  Typical bedtime is between 9 and 10 PM.  It takes her on average 30 to 60 minutes to fall asleep.  She wakes up 4-5 times a night.  She starts her day at 7:30 AM.  No concern for narcolepsy, cataplexy or sleepwalking.  Epworth score is 6.  Sleep questionnaire: Symptoms-  snoring, waking up gasping for air  Prior sleep study- 20 years ago in [place] with ENT, results not available   Bedtime- 9-10pm Time to fall asleep- 30 min to 1 hour Nocturnal awakenings- 4-5 times  Out of bed/start of day- 7:30am Weight changes- up 10 lbs  Do you operate heavy machinery- no Do you currently wear CPAP- no Do you current wear oxygen- no Epworth- 6  Social hx: Patient is married. She has children. She lives with her husband and daughter.  She works as an Print production planner.  No reported tobacco or alcohol use.   03/11/2023- interim hx  Patient presents today today to review sleep study results.  Patient has history of mild OSA but never started on CPAP.  She continued to have symptoms of  snoring and waking up gasping for air.  She wakes up 4-5 times a night.  She had repeat home sleep study on 02/10/2023 that reconfirmed diagnosis of mild obstructive sleep apnea, average AHI 12.3/hour with SpO2 low 83% (baseline 98%).  We discussed treatment options at length.  Patient would like to be started on CPAP therapy due to clinical symptoms.  Observations/Objective:  Appears well without overt respiratory symptoms  Assessment and Plan:  Mild OSA -Symptoms of snoring, restless sleep and waking up gasping for air -HST 02/10/2023 >>AHI 12.3/hour with SpO2 low 83% (baseline 98%) -Reviewed treatment options, patient would like to pursue CPAP therapy due to clinical symptoms -DME order placed for auto CPAP 5 to 15 cm H2O with mask of choice -Advised patient aim to wear CPAP nightly for 4 to 6 hours or longer, work on weight loss efforts and focus on side sleeping position  Follow Up Instructions:  31-90 days after CPAP start for compliance check    I discussed the assessment and treatment plan with the patient. The patient was provided an opportunity to ask questions and all were answered. The patient agreed with the plan and demonstrated an understanding of the instructions.   The patient was advised to call back or seek an in-person evaluation if the symptoms worsen or if the condition fails to improve as anticipated.  I provided 22 minutes of non-face-to-face time during this encounter.   Glenford Bayley, NP

## 2023-03-27 DIAGNOSIS — G4733 Obstructive sleep apnea (adult) (pediatric): Secondary | ICD-10-CM | POA: Diagnosis not present

## 2023-03-31 DIAGNOSIS — E042 Nontoxic multinodular goiter: Secondary | ICD-10-CM | POA: Diagnosis not present

## 2023-03-31 DIAGNOSIS — R1319 Other dysphagia: Secondary | ICD-10-CM | POA: Diagnosis not present

## 2023-04-27 DIAGNOSIS — G4733 Obstructive sleep apnea (adult) (pediatric): Secondary | ICD-10-CM | POA: Diagnosis not present

## 2023-04-28 ENCOUNTER — Ambulatory Visit: Payer: BC Managed Care – PPO | Attending: Cardiology | Admitting: Cardiology

## 2023-04-28 VITALS — BP 124/80 | HR 80 | Resp 16 | Ht 67.0 in | Wt 242.0 lb

## 2023-04-28 DIAGNOSIS — G4733 Obstructive sleep apnea (adult) (pediatric): Secondary | ICD-10-CM | POA: Insufficient documentation

## 2023-04-28 DIAGNOSIS — I10 Essential (primary) hypertension: Secondary | ICD-10-CM | POA: Diagnosis not present

## 2023-04-28 NOTE — Progress Notes (Addendum)
Cardiology Office Note:   Date:  04/28/2023  ID:  Mia Vasquez, DOB 1971-05-07, MRN [id number] PCP: Dani Gobble, PA-C  [facility] Providers Cardiologist:  Verne Carrow, MD    History of Present Illness:   Discussed the use of AI scribe software for clinical note transcription with the patient, who gave verbal consent to proceed.    The patient, a 52 year old female with a history of hypertension, was referred to cardiology last year for evaluation of elevated blood pressure and cardiac risk assessment. She was previously treated with hydrochlorothiazide and losartan, but had to discontinue hydrochlorothiazide due to dehydration. Subsequently resumed when she saw Dr. Clifton James. The patient underwent a cardiac calcium scoring CT, which showed a score of zero, and a complete echocardiogram, which revealed normal left ventricular ejection fraction, moderate LVH, and no significant valvular abnormalities.  The patient has recently started using a CPAP machine for sleep apnea, which she has found challenging due to feelings of claustrophobia and discomfort when sleeping on her side. Despite these difficulties, she reports an improvement in morning headaches and overall sleep quality. She also expresses regret for not starting CPAP therapy earlier, as she believes it has positively impacted her blood pressure and energy levels.  The patient has been experiencing fatigue, particularly in the evenings, which has impacted her ability to engage in regular exercise. She expresses a desire to increase her physical activity as her energy levels improve with CPAP therapy. She reports that her diet is generally healthy, but she has been craving carbohydrates since entering menopause and has gained weight.  The patient's blood pressure has been well controlled on hydrochlorothiazide and losartan, and she has not experienced any significant side effects from these medications. She has not  been monitoring her blood pressure at home regularly, but when she does, it is usually within normal limits. The patient's cholesterol levels have increased slightly since her last check, but she is not currently on any cholesterol-lowering medication.     Today patient denies chest pain, shortness of breath, lower extremity edema, fatigue, palpitations, melena, hematuria, hemoptysis, diaphoresis, weakness, presyncope, syncope, orthopnea, and PND.   Studies Reviewed:    EKG:   EKG Interpretation Date/Time:  Tuesday April 28 2023 11:21:59 EST Ventricular Rate:  80 PR Interval:  194 QRS Duration:  84 QT Interval:  388 QTC Calculation: 447 R Axis:   -13  Text Interpretation: Normal sinus rhythm Possible Left atrial enlargement Minimal voltage criteria for LVH, may be normal variant ( R in aVL ) No previous ECGs available Confirmed by Perlie Gold [phone]) on 04/28/2023 11:40:33 AM   Cardiac Studies & Procedures       ECHOCARDIOGRAM  ECHOCARDIOGRAM COMPLETE 05/28/2022  Narrative ECHOCARDIOGRAM REPORT    Patient Name:   Mia Vasquez Date of Exam: 05/28/2022 Medical Rec #:  [id number]       Height:       67.0 in Accession #:    [id number]      Weight:       226.4 lb Date of Birth:  02-06-1971        BSA:          2.132 m Patient Age:    51 years        BP:           120/75 mmHg Patient Gender: F               HR:  68 bpm. Exam Location:  Outpatient  Procedure: 2D Echo, 3D Echo, Color Doppler, Cardiac Doppler and Strain Analysis  Indications:    Cardiac risk counseling [Z71.89  History:        Patient has no prior history of Echocardiogram examinations. Risk Factors:Non-Smoker.  Sonographer:    Jeryl Columbia RDCS Referring Phys: [id number] CHRISTOPHER D MCALHANY  IMPRESSIONS   1. Left ventricular ejection fraction, by estimation, is 60 to 65%. The left ventricle has normal function. The left ventricle has no regional wall motion abnormalities. There is moderate  concentric left ventricular hypertrophy. Left ventricular diastolic parameters were normal. The average left ventricular global longitudinal strain is -20.0 %. The global longitudinal strain is normal. 2. Right ventricular systolic function is normal. The right ventricular size is normal. Tricuspid regurgitation signal is inadequate for assessing PA pressure. 3. No evidence of mitral valve regurgitation. 4. The aortic valve is grossly normal. Aortic valve regurgitation is not visualized. 5. The inferior vena cava is normal in size with greater than 50% respiratory variability, suggesting right atrial pressure of 3 mmHg.  Comparison(s): No prior Echocardiogram.  Conclusion(s)/Recommendation(s): Normal biventricular function without evidence of hemodynamically significant valvular heart disease.  FINDINGS Left Ventricle: Left ventricular ejection fraction, by estimation, is 60 to 65%. The left ventricle has normal function. The left ventricle has no regional wall motion abnormalities. The average left ventricular global longitudinal strain is -20.0 %. The global longitudinal strain is normal. The left ventricular internal cavity size was normal in size. There is moderate concentric left ventricular hypertrophy. Left ventricular diastolic parameters were normal.  Right Ventricle: The right ventricular size is normal. Right ventricular systolic function is normal. Tricuspid regurgitation signal is inadequate for assessing PA pressure.  Left Atrium: Left atrial size was normal in size.  Right Atrium: Right atrial size was normal in size.  Pericardium: There is no evidence of pericardial effusion.  Mitral Valve: There is mild calcification of the mitral valve leaflet(s). No evidence of mitral valve regurgitation.  Tricuspid Valve: Tricuspid valve regurgitation is not demonstrated.  Aortic Valve: The aortic valve is grossly normal. Aortic valve regurgitation is not visualized.  Pulmonic Valve:  Pulmonic valve regurgitation is not visualized.  Aorta: The aortic root and ascending aorta are structurally normal, with no evidence of dilitation.  Venous: The inferior vena cava is normal in size with greater than 50% respiratory variability, suggesting right atrial pressure of 3 mmHg.  IAS/Shunts: No atrial level shunt detected by color flow Doppler.   LEFT VENTRICLE PLAX 2D LVIDd:         3.97 cm   Diastology LVIDs:         2.34 cm   LV e' medial:    5.77 cm/s LV PW:         1.50 cm   LV E/e' medial:  9.8 LV IVS:        1.29 cm   LV e' lateral:   8.70 cm/s LVOT diam:     2.00 cm   LV E/e' lateral: 6.5 LV SV:         68 LV SV Index:   32        2D Longitudinal Strain LVOT Area:     3.14 cm  2D Strain GLS Avg:     -20.0 %  3D Volume EF: 3D EF:        57 % LV EDV:       134 ml LV ESV:       57  ml LV SV:        76 ml  RIGHT VENTRICLE RV Basal diam:  3.28 cm RV Mid diam:    2.49 cm RV S prime:     13.50 cm/s TAPSE (M-mode): 1.9 cm  LEFT ATRIUM             Index        RIGHT ATRIUM           Index LA diam:        4.20 cm 1.97 cm/m   RA Area:     12.20 cm LA Vol (A2C):   54.0 ml 25.33 ml/m  RA Volume:   25.40 ml  11.92 ml/m LA Vol (A4C):   41.9 ml 19.66 ml/m LA Biplane Vol: 49.9 ml 23.41 ml/m AORTIC VALVE LVOT Vmax:   106.00 cm/s LVOT Vmean:  66.000 cm/s LVOT VTI:    0.215 m  AORTA Ao Root diam: 3.50 cm Ao Asc diam:  3.10 cm  MITRAL VALVE MV Area (PHT): 3.91 cm    SHUNTS MV Decel Time: 194 msec    Systemic VTI:  0.22 m MV E velocity: 56.60 cm/s  Systemic Diam: 2.00 cm MV A velocity: 52.90 cm/s MV E/A ratio:  1.07  [place] signed by Carolan Clines Signature Date/Time: 05/28/2022/2:35:40 PM    Final     CT SCANS  CT CARDIAC SCORING (SELF PAY ONLY) 05/28/2022  Addendum 05/28/2022  3:05 PM ADDENDUM REPORT: 05/28/2022 15:03  EXAM: OVER-READ INTERPRETATION  CT CHEST  The following report is a limited chest CT over-read performed  by radiologist Dr. Irma Newness [facility] on 05/28/2022. This over-read does not include interpretation of cardiac or coronary anatomy or pathology. The coronary calcium score interpretation by the cardiologist is attached.  COMPARISON:  None.  FINDINGS: Mediastinum/Nodes: No enlarged lymph nodes within the visualized mediastinum.  Lungs/Pleura: There is no pleural effusion. Mild linear scarring or atelectasis in the lingula. The lung bases are otherwise clear.  Upper abdomen: No significant findings in the visualized upper abdomen.  Musculoskeletal/Chest wall: No chest wall mass or suspicious osseous findings within the visualized chest.  IMPRESSION: No significant extracardiac findings within the visualized chest.   Electronically Signed By: Carey Bullocks M.D. On: 05/28/2022 15:03  Narrative CLINICAL DATA:  Risk stratification  EXAM: Coronary Calcium Score  TECHNIQUE: The patient was scanned on a Siemens Somatom 64 slice scanner. Axial non-contrast 3mm slices were carried out through the heart. The data set was analyzed on a dedicated work station and scored using the Agatson method.  FINDINGS: Non-cardiac: No significant non cardiac findings on limited lung and soft tissue windows. See separate report from [facility].  Ascending Aorta: Mildly dilated 3.8 cm  Pericardium: Normal  Coronary arteries: No calcium noted  IMPRESSION: Coronary calcium score of 0.  Charlton Haws  Electronically Signed: By: Charlton Haws M.D. On: 05/28/2022 14:13           Risk Assessment/Calculations:              Physical Exam:   VS:  BP 124/80   Pulse 80   Resp 16   Ht 5\' 7"  (1.702 m)   Wt 242 lb (109.8 kg)   SpO2 94%   BMI 37.90 kg/m    Wt Readings from Last 3 Encounters:  04/28/23 242 lb (109.8 kg)  11/18/22 230 lb (104.3 kg)  10/23/22 235 lb (106.6 kg)     Physical Exam Vitals reviewed.  Constitutional:  Appearance:  Normal appearance.  HENT:     Head: Normocephalic.     Nose: Nose normal.  Eyes:     Pupils: Pupils are equal, round, and reactive to light.  Cardiovascular:     Rate and Rhythm: Normal rate and regular rhythm.     Pulses: Normal pulses.     Heart sounds: Normal heart sounds. No murmur heard.    No friction rub. No gallop.  Pulmonary:     Effort: Pulmonary effort is normal.     Breath sounds: Normal breath sounds.  Musculoskeletal:     Right lower leg: No edema.     Left lower leg: No edema.  Skin:    General: Skin is warm and dry.     Capillary Refill: Capillary refill takes less than 2 seconds.  Neurological:     General: No focal deficit present.     Mental Status: She is alert and oriented to person, place, and time.  Psychiatric:        Mood and Affect: Mood normal.        Behavior: Behavior normal.        Thought Content: Thought content normal.        Judgment: Judgment normal.     Physical Exam   VITALS: BP- 124/80 MEASUREMENTS: WT- 242 CHEST: Lungs clear to auscultation. CARDIOVASCULAR: Heart rhythm regular. Good strong pulses noted.       ASSESSMENT AND PLAN:     Assessment and Plan    Hypertension Well-controlled on hydrochlorothiazide 25 mg daily and losartan 100 mg daily. Blood pressure today is 124/80 mmHg. No symptoms of dizziness, lightheadedness, or palpitations. Normal EKG and echocardiogram with normal left ventricular ejection fraction and diastolic function. Moderate left ventricular hypertrophy likely secondary to elevated blood pressure. Discussed the importance of CPAP therapy in managing blood pressure and overall energy levels. - Continue hydrochlorothiazide 25 mg daily - Continue losartan 100 mg daily - Monitor blood pressure at home - Annual follow-up with cardiology  Obstructive Sleep Apnea Using CPAP for about a month with some difficulty adjusting to the mask. Reports improvement in morning headaches and overall symptoms. - Continue  CPAP therapy  Cholesterol monitoring LDL cholesterol increased from 68 mg/dL in September 2023 to 96 mg/dL in March 2956. Total cholesterol increased from 133 mg/dL to 213 mg/dL. Weight gain noted from 226 lbs to 242 lbs. No coronary calcium detected on cardiac CT. No current medication for cholesterol. Discussed the importance of diet and exercise in managing cholesterol levels. If LDL consistently above 100 mg/dL, consider cholesterol medication. - Monitor lipid profile - Consider cholesterol medication if LDL consistently above 100 mg/dL - Encourage diet and lifestyle modifications to improve cholesterol levels  General Health Maintenance Reports better diet than exercise but struggles with carbohydrate cravings post-menopause. Encouraged to start small with exercise routines and gradually increase. Discussed the importance of weight management and its impact on cholesterol and overall health. - Encourage regular exercise, starting with 10 minutes daily and gradually increasing - Monitor weight and dietary habits - Annual physical examination with primary care provider  Follow-up - Schedule annual follow-up with cardiology - Contact via MyChart for any concerns or questions.             Signed, Perlie Gold, PA-C

## 2023-04-28 NOTE — Patient Instructions (Signed)
Medication Instructions:  Your physician recommends that you continue on your current medications as directed. Please refer to the Current Medication list given to you today.  *If you need a refill on your cardiac medications before your next appointment, please call your pharmacy*   Lab Work: None ordered   If you have labs (blood work) drawn today and your tests are completely normal, you will receive your results only by: MyChart Message (if you have MyChart) OR A paper copy in the mail If you have any lab test that is abnormal or we need to change your treatment, we will call you to review the results.   Testing/Procedures: None ordered    Follow-Up: At [facility], you and your health needs are our priority.  As part of our continuing mission to provide you with exceptional heart care, we have created designated Provider Care Teams.  These Care Teams include your primary Cardiologist (physician) and Advanced Practice Providers (APPs -  Physician Assistants and Nurse Practitioners) who all work together to provide you with the care you need, when you need it.  We recommend signing up for the patient portal called "MyChart".  Sign up information is provided on this After Visit Summary.  MyChart is used to connect with patients for Virtual Visits (Telemedicine).  Patients are able to view lab/test results, encounter notes, upcoming appointments, etc.  Non-urgent messages can be sent to your provider as well.   To learn more about what you can do with MyChart, go to [email or web address].    Your next appointment:   12 month(s)  Provider:   Christopher McAlhany, MD     Other Instructions   

## 2023-05-27 DIAGNOSIS — G4733 Obstructive sleep apnea (adult) (pediatric): Secondary | ICD-10-CM | POA: Diagnosis not present

## 2023-06-01 DIAGNOSIS — Z1231 Encounter for screening mammogram for malignant neoplasm of breast: Secondary | ICD-10-CM | POA: Diagnosis not present

## 2023-06-01 DIAGNOSIS — Z01419 Encounter for gynecological examination (general) (routine) without abnormal findings: Secondary | ICD-10-CM | POA: Diagnosis not present

## 2023-06-01 DIAGNOSIS — Z6838 Body mass index (BMI) 38.0-38.9, adult: Secondary | ICD-10-CM | POA: Diagnosis not present

## 2023-06-12 ENCOUNTER — Telehealth: Payer: BC Managed Care – PPO | Admitting: Primary Care

## 2023-06-26 ENCOUNTER — Encounter: Payer: Self-pay | Admitting: Primary Care

## 2023-06-26 ENCOUNTER — Telehealth: Payer: BC Managed Care – PPO | Admitting: Primary Care

## 2023-06-26 DIAGNOSIS — G4730 Sleep apnea, unspecified: Secondary | ICD-10-CM | POA: Diagnosis not present

## 2023-06-26 DIAGNOSIS — E66811 Obesity, class 1: Secondary | ICD-10-CM | POA: Diagnosis not present

## 2023-06-26 NOTE — Patient Instructions (Addendum)
-  MILD SLEEP APNEA: Mild sleep apnea is a condition where your breathing stops and starts during sleep, leading to poor sleep quality. Since you have had trouble tolerating the CPAP device, we have decided to discontinue its use and refer you for an oral appliance, which may be more comfortable for you. If you wish to retry CPAP in the future, a repeat sleep study can be considered. Please contact your CPAP provider to return the machine.  -WEIGHT MANAGEMENT: Managing your weight can significantly improve sleep apnea symptoms. You have joined Weight [employer], which is a positive step. Discuss the possibility of weight loss medication called Zepbound (tirzepatide) with your primary care provider which is approved for weight loss/treatment of OSA.   INSTRUCTIONS: Please follow up in 6 months or sooner if you experience issues with the oral appliance or insomnia.

## 2023-06-26 NOTE — Progress Notes (Signed)
 Virtual Visit via Video Note  I connected with Mia Vasquez on 06/26/23 at  2:00 PM EST by a video enabled telemedicine application and verified that I am speaking with the correct person using two identifiers.  Location: Patient: Home Provider: Office     I discussed the limitations of evaluation and management by telemedicine and the availability of in person appointments. The patient expressed understanding and agreed to proceed.  History of Present Illness: 53 year old female, never smoked. PMH significant for HTN, multinodular goiter, meniere disease, obesity.   Previous [facility] pulmonary encounter:  10/23/2022 Patient presents today for sleep consult. She had sleep study 20 years ago and reports she had mild OSA but insurance would not cover CPAP.  She reports waking up gasping for air several nights a week. She does snore. Unable to sleep on her back. Associated daytime fatigue. Dentist concerned about sleep apnea.  Typical bedtime is between 9 and 10 PM.  It takes her on average 30 to 60 minutes to fall asleep.  She wakes up 4-5 times a night.  She starts her day at 7:30 AM.  No concern for narcolepsy, cataplexy or sleepwalking.  Epworth score is 6.  Sleep questionnaire: Symptoms-  snoring, waking up gasping for air  Prior sleep study- 20 years ago in [place] with ENT, results not available   Bedtime- 9-10pm Time to fall asleep- 30 min to 1 hour Nocturnal awakenings- 4-5 times  Out of bed/start of day- 7:30am Weight changes- up 10 lbs  Do you operate heavy machinery- no Do you currently wear CPAP- no Do you current wear oxygen- no Epworth- 6  Social hx: Patient is married. She has children. She lives with her husband and daughter.  She works as an print production planner.  No reported tobacco or alcohol use.   03/11/2023 Patient presents today today to review sleep study results.  Patient has history of mild OSA but never started on CPAP.  She continued to have symptoms of snoring and  waking up gasping for air.  She wakes up 4-5 times a night.  She had repeat home sleep study on 02/10/2023 that reconfirmed diagnosis of mild obstructive sleep apnea, average AHI 12.3/hour with SpO2 low 83% (baseline 98%).  We discussed treatment options at length.  Patient would like to be started on CPAP therapy due to clinical symptoms.  Mild OSA -Symptoms of snoring, restless sleep and waking up gasping for air -HST 02/10/2023 >>AHI 12.3/hour with SpO2 low 83% (baseline 98%) -Reviewed treatment options, patient would like to pursue CPAP therapy due to clinical symptoms -DME order placed for auto CPAP 5 to 15 cm H2O with mask of choice -Advised patient aim to wear CPAP nightly for 4 to 6 hours or longer, work on weight loss efforts and focus on side sleeping position    06/26/2023- interim hx  Discussed the use of AI scribe software for clinical note transcription with the patient, who gave verbal consent to proceed.  History of Present Illness   The patient, diagnosed with mild sleep apnea, was previously initiated on CPAP therapy following a sleep study in August. The decision to start CPAP was due to symptoms of snoring, restless sleep, and episodes of waking up gasping for air. However, the patient reports difficulty tolerating the CPAP device, leading to disrupted sleep. Despite trying different masks, including a full face mask and a nasal pillow, the patient continues to struggle with the device, often waking up in the middle of the night  unable to fall back asleep due to the discomfort of having the device on her face. The patient reports no noticeable improvement in sleep quality on nights when she was able to tolerate the device for more than four hours. The patient has expressed interest in exploring alternative treatment options, such as an oral appliance. The patient also reports joining a weight loss program in an effort to manage her sleep apnea.      Airview download 05/13/2023 -  06/11/2023 Usage days 29/30 days (97%); 18 days (60%) greater than 4 hours Average usage 3 hours 15 minutes Pressure 5 to 15 cm H2O (6.5 cm H2O-95%) Air leaks 10.3 L/min (95%) AHI 0.9  Observations/Objective:  Appears well without overt respiratory symptoms  Assessment and Plan:  1. Mild sleep apnea (Primary) - Ambulatory referral to Orthodontics  2. Obesity (BMI 30.0-34.9)     Mild Sleep Apnea Patient has been struggling with CPAP compliance due to discomfort and lack of perceived benefit. Discussed the mild nature of the sleep apnea and the options of either trying a sleep aid to improve CPAP tolerance or discontinuing CPAP and trying an oral appliance. -Discontinue CPAP and refer for oral appliance. -Consider repeat sleep study if patient wishes to retry CPAP in the future. -Advise patient to contact CPAP provider to return the machine.  Weight Management Patient has joined Weight [employer] and is attempting to lose weight, which can help with sleep apnea. -Discussed the possibility of considering weight loss medication, specifically Zeb bound, with primary care provider. -Encourage continued weight loss efforts.  Follow Up Instructions:  Follow-up in 6 months or sooner if patient has issues with the oral appliance or insomnia.       I discussed the assessment and treatment plan with the patient. The patient was provided an opportunity to ask questions and all were answered. The patient agreed with the plan and demonstrated an understanding of the instructions.   The patient was advised to call back or seek an in-person evaluation if the symptoms worsen or if the condition fails to improve as anticipated.  I provided 22 minutes of non-face-to-face time during this encounter.   Almarie LELON Ferrari, NP

## 2023-06-26 NOTE — Progress Notes (Deleted)
 @Patient  ID: Mia Vasquez, female    DOB: [birth year], 53 y.o.   MRN: [id number]  No chief complaint on file.   Referring provider: Jacques Camie Pepper, SHAUNNA*  HPI:  53 year old female, never smoked. PMH significant for HTN, multinodular goiter, meniere disease, obesity.   Previous [facility] pulmonary encounter:  10/23/2022 Patient presents today for sleep consult. She had sleep study 20 years ago and reports she had mild OSA but insurance would not cover CPAP.  She reports waking up gasping for air several nights a week. She does snore. Unable to sleep on her back. Associated daytime fatigue. Dentist concerned about sleep apnea.  Typical bedtime is between 9 and 10 PM.  It takes her on average 30 to 60 minutes to fall asleep.  She wakes up 4-5 times a night.  She starts her day at 7:30 AM.  No concern for narcolepsy, cataplexy or sleepwalking.  Epworth score is 6.  Sleep questionnaire: Symptoms-  snoring, waking up gasping for air  Prior sleep study- 20 years ago in [place] with ENT, results not available   Bedtime- 9-10pm Time to fall asleep- 30 min to 1 hour Nocturnal awakenings- 4-5 times  Out of bed/start of day- 7:30am Weight changes- up 10 lbs  Do you operate heavy machinery- no Do you currently wear CPAP- no Do you current wear oxygen- no Epworth- 6  Social hx: Patient is married. She has children. She lives with her husband and daughter.  She works as an print production planner.  No reported tobacco or alcohol use.   03/11/2023- interim hx  Patient presents today today to review sleep study results.  Patient has history of mild OSA but never started on CPAP.  She continued to have symptoms of snoring and waking up gasping for air.  She wakes up 4-5 times a night.  She had repeat home sleep study on 02/10/2023 that reconfirmed diagnosis of mild obstructive sleep apnea, average AHI 12.3/hour with SpO2 low 83% (baseline 98%).  We discussed treatment options at length.  Patient would like to be  started on CPAP therapy due to clinical symptoms.  Mild OSA -Symptoms of snoring, restless sleep and waking up gasping for air -HST 02/10/2023 >>AHI 12.3/hour with SpO2 low 83% (baseline 98%) -Reviewed treatment options, patient would like to pursue CPAP therapy due to clinical symptoms -DME order placed for auto CPAP 5 to 15 cm H2O with mask of choice -Advised patient aim to wear CPAP nightly for 4 to 6 hours or longer, work on weight loss efforts and focus on side sleeping position    06/26/2023 Patient contacted today for CPAP compliance.        Allergies  Allergen Reactions   Shrimp [Shellfish Allergy] Anaphylaxis   Penicillins Rash   Wound Dressing Adhesive Rash    Immunization History  Administered Date(s) Administered   PFIZER(Purple Top)SARS-COV-2 Vaccination 01/24/2020, 05/03/2020, 10/11/2020    Past Medical History:  Diagnosis Date   Allergy    SEASONAL   Depression    GERD (gastroesophageal reflux disease)    Hx of adenomatous polyp of colon 11/18/2022   11/2022 diminutive adenoma and distal hpp recall 2031   Hypertension    Insomnia    IUD (intrauterine device) in place    Meniere's disease    Panic attack    Sleep apnea    Thyroid  disease     Tobacco History: Social History   Tobacco Use  Smoking Status Never  Smokeless Tobacco Never  Counseling given: Not Answered   Outpatient Medications Prior to Visit  Medication Sig Dispense Refill   ALPRAZolam (XANAX) 0.25 MG tablet Take 0.25 mg by mouth at bedtime as needed for anxiety.     Cholecalciferol (VITAMIN D-3 PO) Take by mouth daily. TAKE 1500 UNITS DAILY     EPINEPHrine 0.3 mg/0.3 mL IJ SOAJ injection Inject into the muscle.     hydrochlorothiazide  (HYDRODIURIL ) 25 MG tablet Take 1 tablet (25 mg total) by mouth daily. 90 tablet 3   levonorgestrel  (MIRENA ) 20 MCG/24HR IUD 1 each by Intrauterine route once.     losartan (COZAAR) 100 MG tablet Take 100 mg by mouth daily.     sertraline  (ZOLOFT) 25 MG tablet Take 25 mg by mouth daily.     No facility-administered medications prior to visit.      Review of Systems  Review of Systems   Physical Exam  There were no vitals taken for this visit. Physical Exam   Lab Results:  CBC No results found for: WBC, RBC, HGB, HCT, PLT, MCV, MCH, MCHC, RDW, LYMPHSABS, MONOABS, EOSABS, BASOSABS  BMET No results found for: NA, K, CL, CO2, GLUCOSE, BUN, CREATININE, CALCIUM, GFRNONAA, GFRAA  BNP No results found for: BNP  ProBNP No results found for: PROBNP  Imaging: No results found.   Assessment & Plan:   No problem-specific Assessment & Plan notes found for this encounter.     Almarie LELON Ferrari, NP 06/26/2023

## 2023-06-27 DIAGNOSIS — G4733 Obstructive sleep apnea (adult) (pediatric): Secondary | ICD-10-CM | POA: Diagnosis not present

## 2023-07-09 NOTE — Telephone Encounter (Signed)
Fine to provide a note to discontinue CPAP, can also send my note

## 2023-07-09 NOTE — Telephone Encounter (Signed)
I have received the following message from pt "Hi [name],  I tried to return my CPAP to [facility]) but they would not accept it back without a note from you saying that I no longer am using it. They said you would need to fax a note to them stating that I no longer am using the cpap. Their fax number is [phone].  Also, if you could send me a copy of the note I would appreciate it. Just in case they say they have not received the fax. I want to return the cpap to them tomorrow so that I will not be charged for it for another month, an automatic draft of $197 is due to come out of my account Wednesday if not returned by then.  Thank you so much! Mia Vasquez "  Beth please advise on if this is okay.

## 2023-07-14 NOTE — Telephone Encounter (Signed)
ATC Mitch with [employer].  I left a detailed message with the patient's name, dob and phone #.  I also gave details of the issue.  I left the number for the [place] office and the [place] office where I am working today and tomorrow if he needs further information.

## 2023-07-31 NOTE — Telephone Encounter (Signed)
Mia Vasquez, has [employer] responded to you yet?

## 2023-08-03 DIAGNOSIS — Z79899 Other long term (current) drug therapy: Secondary | ICD-10-CM | POA: Diagnosis not present

## 2023-08-03 DIAGNOSIS — F419 Anxiety disorder, unspecified: Secondary | ICD-10-CM | POA: Diagnosis not present

## 2023-08-03 DIAGNOSIS — G500 Trigeminal neuralgia: Secondary | ICD-10-CM | POA: Diagnosis not present

## 2023-08-03 DIAGNOSIS — I10 Essential (primary) hypertension: Secondary | ICD-10-CM | POA: Diagnosis not present

## 2023-08-03 DIAGNOSIS — Z1330 Encounter for screening examination for mental health and behavioral disorders, unspecified: Secondary | ICD-10-CM | POA: Diagnosis not present

## 2023-08-10 DIAGNOSIS — D2239 Melanocytic nevi of other parts of face: Secondary | ICD-10-CM | POA: Diagnosis not present

## 2023-08-10 DIAGNOSIS — L219 Seborrheic dermatitis, unspecified: Secondary | ICD-10-CM | POA: Diagnosis not present

## 2023-08-10 DIAGNOSIS — L821 Other seborrheic keratosis: Secondary | ICD-10-CM | POA: Diagnosis not present

## 2023-08-10 DIAGNOSIS — L853 Xerosis cutis: Secondary | ICD-10-CM | POA: Diagnosis not present

## 2023-08-10 DIAGNOSIS — D485 Neoplasm of uncertain behavior of skin: Secondary | ICD-10-CM | POA: Diagnosis not present

## 2023-08-14 DIAGNOSIS — I10 Essential (primary) hypertension: Secondary | ICD-10-CM | POA: Diagnosis not present

## 2023-08-14 DIAGNOSIS — R519 Headache, unspecified: Secondary | ICD-10-CM | POA: Diagnosis not present

## 2023-08-21 DIAGNOSIS — E042 Nontoxic multinodular goiter: Secondary | ICD-10-CM | POA: Diagnosis not present

## 2023-08-25 DIAGNOSIS — E042 Nontoxic multinodular goiter: Secondary | ICD-10-CM | POA: Diagnosis not present

## 2023-08-25 DIAGNOSIS — I10 Essential (primary) hypertension: Secondary | ICD-10-CM | POA: Diagnosis not present

## 2023-08-25 DIAGNOSIS — D351 Benign neoplasm of parathyroid gland: Secondary | ICD-10-CM | POA: Diagnosis not present

## 2023-08-31 DIAGNOSIS — I10 Essential (primary) hypertension: Secondary | ICD-10-CM | POA: Diagnosis not present

## 2023-08-31 DIAGNOSIS — Z79899 Other long term (current) drug therapy: Secondary | ICD-10-CM | POA: Diagnosis not present

## 2023-08-31 DIAGNOSIS — Z6835 Body mass index (BMI) 35.0-35.9, adult: Secondary | ICD-10-CM | POA: Diagnosis not present

## 2023-08-31 DIAGNOSIS — Z0000 Encounter for general adult medical examination without abnormal findings: Secondary | ICD-10-CM | POA: Diagnosis not present

## 2023-08-31 DIAGNOSIS — Z131 Encounter for screening for diabetes mellitus: Secondary | ICD-10-CM | POA: Diagnosis not present

## 2023-08-31 DIAGNOSIS — Z130 Encounter for screening for diseases of the blood and blood-forming organs and certain disorders involving the immune mechanism: Secondary | ICD-10-CM | POA: Diagnosis not present

## 2023-08-31 DIAGNOSIS — Z1321 Encounter for screening for nutritional disorder: Secondary | ICD-10-CM | POA: Diagnosis not present

## 2023-08-31 DIAGNOSIS — E042 Nontoxic multinodular goiter: Secondary | ICD-10-CM | POA: Diagnosis not present

## 2023-08-31 DIAGNOSIS — F419 Anxiety disorder, unspecified: Secondary | ICD-10-CM | POA: Diagnosis not present

## 2023-11-04 DIAGNOSIS — E042 Nontoxic multinodular goiter: Secondary | ICD-10-CM | POA: Diagnosis not present

## 2023-11-16 DIAGNOSIS — E559 Vitamin D deficiency, unspecified: Secondary | ICD-10-CM | POA: Diagnosis not present

## 2023-11-16 DIAGNOSIS — B9689 Other specified bacterial agents as the cause of diseases classified elsewhere: Secondary | ICD-10-CM | POA: Diagnosis not present

## 2023-11-16 DIAGNOSIS — I10 Essential (primary) hypertension: Secondary | ICD-10-CM | POA: Diagnosis not present

## 2023-11-16 DIAGNOSIS — J019 Acute sinusitis, unspecified: Secondary | ICD-10-CM | POA: Diagnosis not present

## 2023-11-26 DIAGNOSIS — E559 Vitamin D deficiency, unspecified: Secondary | ICD-10-CM | POA: Diagnosis not present

## 2023-12-14 DIAGNOSIS — G5601 Carpal tunnel syndrome, right upper limb: Secondary | ICD-10-CM | POA: Diagnosis not present

## 2023-12-28 DIAGNOSIS — H31012 Macula scars of posterior pole (postinflammatory) (post-traumatic), left eye: Secondary | ICD-10-CM | POA: Diagnosis not present

## 2023-12-28 DIAGNOSIS — H33311 Horseshoe tear of retina without detachment, right eye: Secondary | ICD-10-CM | POA: Diagnosis not present

## 2023-12-28 DIAGNOSIS — H43811 Vitreous degeneration, right eye: Secondary | ICD-10-CM | POA: Diagnosis not present

## 2024-02-01 DIAGNOSIS — H31012 Macula scars of posterior pole (postinflammatory) (post-traumatic), left eye: Secondary | ICD-10-CM | POA: Diagnosis not present

## 2024-02-01 DIAGNOSIS — H43811 Vitreous degeneration, right eye: Secondary | ICD-10-CM | POA: Diagnosis not present

## 2024-02-01 DIAGNOSIS — H33311 Horseshoe tear of retina without detachment, right eye: Secondary | ICD-10-CM | POA: Diagnosis not present

## 2024-02-09 DIAGNOSIS — G5600 Carpal tunnel syndrome, unspecified upper limb: Secondary | ICD-10-CM | POA: Diagnosis not present

## 2024-02-09 DIAGNOSIS — I10 Essential (primary) hypertension: Secondary | ICD-10-CM | POA: Diagnosis not present

## 2024-03-07 DIAGNOSIS — F419 Anxiety disorder, unspecified: Secondary | ICD-10-CM | POA: Diagnosis not present

## 2024-03-07 DIAGNOSIS — E559 Vitamin D deficiency, unspecified: Secondary | ICD-10-CM | POA: Diagnosis not present

## 2024-03-07 DIAGNOSIS — Z79899 Other long term (current) drug therapy: Secondary | ICD-10-CM | POA: Diagnosis not present

## 2024-03-07 DIAGNOSIS — I10 Essential (primary) hypertension: Secondary | ICD-10-CM | POA: Diagnosis not present

## 2024-04-04 DIAGNOSIS — H31012 Macula scars of posterior pole (postinflammatory) (post-traumatic), left eye: Secondary | ICD-10-CM | POA: Diagnosis not present

## 2024-04-04 DIAGNOSIS — H43811 Vitreous degeneration, right eye: Secondary | ICD-10-CM | POA: Diagnosis not present

## 2024-04-04 DIAGNOSIS — H33311 Horseshoe tear of retina without detachment, right eye: Secondary | ICD-10-CM | POA: Diagnosis not present

## 2024-04-07 DIAGNOSIS — I10 Essential (primary) hypertension: Secondary | ICD-10-CM | POA: Diagnosis not present
